# Patient Record
Sex: Female | Born: 1945
Health system: Southern US, Community
[De-identification: ages and names within clinical notes are randomized; demographics above are authoritative.]

## PROBLEM LIST (undated history)

## (undated) DIAGNOSIS — Z8719 Personal history of other diseases of the digestive system: Secondary | ICD-10-CM

## (undated) DIAGNOSIS — M419 Scoliosis, unspecified: Secondary | ICD-10-CM

## (undated) DIAGNOSIS — K5730 Diverticulosis of large intestine without perforation or abscess without bleeding: Secondary | ICD-10-CM

## (undated) DIAGNOSIS — M1990 Unspecified osteoarthritis, unspecified site: Secondary | ICD-10-CM

## (undated) DIAGNOSIS — K824 Cholesterolosis of gallbladder: Secondary | ICD-10-CM

## (undated) DIAGNOSIS — R928 Other abnormal and inconclusive findings on diagnostic imaging of breast: Secondary | ICD-10-CM

## (undated) DIAGNOSIS — I700 Atherosclerosis of aorta: Secondary | ICD-10-CM

## (undated) DIAGNOSIS — Z1211 Encounter for screening for malignant neoplasm of colon: Secondary | ICD-10-CM

## (undated) DIAGNOSIS — N6019 Diffuse cystic mastopathy of unspecified breast: Secondary | ICD-10-CM

## (undated) DIAGNOSIS — Z86010 Personal history of colonic polyps: Secondary | ICD-10-CM

## (undated) DIAGNOSIS — K7689 Other specified diseases of liver: Secondary | ICD-10-CM

## (undated) DIAGNOSIS — Z8489 Family history of other specified conditions: Secondary | ICD-10-CM

## (undated) DIAGNOSIS — E785 Hyperlipidemia, unspecified: Secondary | ICD-10-CM

## (undated) DIAGNOSIS — K219 Gastro-esophageal reflux disease without esophagitis: Secondary | ICD-10-CM

## (undated) DIAGNOSIS — Z87891 Personal history of nicotine dependence: Secondary | ICD-10-CM

## (undated) DIAGNOSIS — I209 Angina pectoris, unspecified: Secondary | ICD-10-CM

## (undated) HISTORY — DX: Other abnormal and inconclusive findings on diagnostic imaging of breast: R92.8

## (undated) HISTORY — PX: TOTAL KNEE ARTHROPLASTY: SHX125

## (undated) HISTORY — DX: Diffuse cystic mastopathy of unspecified breast: N60.19

## (undated) HISTORY — DX: Personal history of nicotine dependence: Z87.891

## (undated) HISTORY — PX: FOOT SURGERY: SHX648

## (undated) HISTORY — DX: Diverticulosis of large intestine without perforation or abscess without bleeding: K57.30

## (undated) HISTORY — PX: EYE SURGERY: SHX253

## (undated) HISTORY — PX: COLONOSCOPY: SHX174

## (undated) HISTORY — DX: Unspecified osteoarthritis, unspecified site: M19.90

## (undated) HISTORY — PX: TONSILLECTOMY: SUR1361

## (undated) HISTORY — DX: Personal history of colonic polyps: Z86.010

## (undated) HISTORY — DX: Encounter for screening for malignant neoplasm of colon: Z12.11

---

## 1989-04-25 HISTORY — PX: ABDOMINAL HYSTERECTOMY: SHX81

## 1998-04-25 DIAGNOSIS — Z860100 Personal history of colon polyps, unspecified: Secondary | ICD-10-CM

## 1998-04-25 HISTORY — DX: Personal history of colon polyps, unspecified: Z86.0100

## 1998-04-25 HISTORY — DX: Personal history of colonic polyps: Z86.010

## 1999-04-26 HISTORY — PX: FLEXIBLE SIGMOIDOSCOPY: SHX1649

## 2003-04-26 DIAGNOSIS — M1990 Unspecified osteoarthritis, unspecified site: Secondary | ICD-10-CM

## 2003-04-26 HISTORY — DX: Unspecified osteoarthritis, unspecified site: M19.90

## 2004-02-12 ENCOUNTER — Ambulatory Visit: Payer: Self-pay | Admitting: General Surgery

## 2005-03-01 ENCOUNTER — Ambulatory Visit: Payer: Self-pay | Admitting: General Surgery

## 2006-04-13 ENCOUNTER — Ambulatory Visit: Payer: Self-pay | Admitting: General Surgery

## 2006-08-18 ENCOUNTER — Ambulatory Visit: Payer: Self-pay | Admitting: General Surgery

## 2007-05-03 ENCOUNTER — Ambulatory Visit: Payer: Self-pay | Admitting: General Surgery

## 2008-05-05 ENCOUNTER — Ambulatory Visit: Payer: Self-pay | Admitting: General Surgery

## 2009-05-07 ENCOUNTER — Ambulatory Visit: Payer: Self-pay | Admitting: General Surgery

## 2010-04-25 HISTORY — PX: HAND SURGERY: SHX662

## 2010-05-12 ENCOUNTER — Ambulatory Visit: Payer: Self-pay | Admitting: General Surgery

## 2011-01-26 ENCOUNTER — Ambulatory Visit: Payer: Self-pay | Admitting: Urology

## 2011-04-26 DIAGNOSIS — Z87891 Personal history of nicotine dependence: Secondary | ICD-10-CM

## 2011-04-26 DIAGNOSIS — Z1211 Encounter for screening for malignant neoplasm of colon: Secondary | ICD-10-CM

## 2011-04-26 HISTORY — DX: Personal history of nicotine dependence: Z87.891

## 2011-04-26 HISTORY — DX: Encounter for screening for malignant neoplasm of colon: Z12.11

## 2011-05-17 ENCOUNTER — Ambulatory Visit: Payer: Self-pay | Admitting: General Surgery

## 2011-05-18 ENCOUNTER — Ambulatory Visit: Payer: Self-pay | Admitting: General Surgery

## 2011-07-15 ENCOUNTER — Ambulatory Visit: Payer: Self-pay | Admitting: General Surgery

## 2012-05-17 ENCOUNTER — Ambulatory Visit: Payer: Self-pay | Admitting: General Surgery

## 2012-05-22 ENCOUNTER — Ambulatory Visit: Payer: Self-pay | Admitting: General Surgery

## 2012-08-03 ENCOUNTER — Encounter: Payer: Self-pay | Admitting: *Deleted

## 2012-08-03 DIAGNOSIS — Z87891 Personal history of nicotine dependence: Secondary | ICD-10-CM | POA: Insufficient documentation

## 2012-08-03 DIAGNOSIS — Z860100 Personal history of colon polyps, unspecified: Secondary | ICD-10-CM | POA: Insufficient documentation

## 2012-08-03 DIAGNOSIS — Z1211 Encounter for screening for malignant neoplasm of colon: Secondary | ICD-10-CM | POA: Insufficient documentation

## 2012-08-08 ENCOUNTER — Other Ambulatory Visit: Payer: Self-pay | Admitting: *Deleted

## 2012-08-08 ENCOUNTER — Other Ambulatory Visit: Payer: Self-pay

## 2012-08-08 ENCOUNTER — Encounter: Payer: Self-pay | Admitting: General Surgery

## 2012-08-08 ENCOUNTER — Ambulatory Visit (INDEPENDENT_AMBULATORY_CARE_PROVIDER_SITE_OTHER): Payer: Medicare Other | Admitting: General Surgery

## 2012-08-08 VITALS — BP 110/80 | HR 84 | Resp 12 | Ht 66.0 in | Wt 143.0 lb

## 2012-08-08 DIAGNOSIS — R928 Other abnormal and inconclusive findings on diagnostic imaging of breast: Secondary | ICD-10-CM

## 2012-08-08 DIAGNOSIS — N6019 Diffuse cystic mastopathy of unspecified breast: Secondary | ICD-10-CM

## 2012-08-08 NOTE — Patient Instructions (Addendum)
Continue self breast exams.  Call for any new breast issues. Mammogram in 3 months

## 2012-08-08 NOTE — Progress Notes (Signed)
Patient ID: Yvonne White, female   DOB: Sep 07, 1945, 66 y.o.   MRN: [id number]  Chief Complaint  Patient presents with  . Follow-up    HPI Yvonne White is a 67 y.o. female.  Patient here today for 2 month follow up ultrasound of left breast.  Patient denies any new breast issues. On her annual follow up 2 months ago an ill-defined density was noted in superior central left breast.  Ultrasound at that location showed no findings. HPI  Past Medical History  Diagnosis Date  . Diverticulosis of colon (without mention of hemorrhage)   . Personal history of tobacco use, presenting hazards to health 2013  . Diffuse cystic mastopathy   . Abnormal mammogram, unspecified   . Arthritis 2005  . Personal history of colonic polyps 2000    large polyp  . Special screening for malignant neoplasms, colon 2013    family history of colon polyp and cancer    Past Surgical History  Procedure Laterality Date  . Abdominal hysterectomy  1991  . Foot surgery  2009,2010, 2014  . Colonoscopy  2000,2003,2008,2013    Dr. Cherly Anderson  . Flexible sigmoidoscopy  2001  . Hand surgery Left 2012    Family History  Problem Relation Age of Onset  . Colon polyps Mother     pre cancerous  . Cancer Maternal Aunt     breast cancer    Social History History  Substance Use Topics  . Smoking status: Former Smoker -- 20 years    Types: Cigarettes  . Smokeless tobacco: Not on file     Comment: quit 2005  . Alcohol Use: 1.0 oz/week    2 drink(s) per week    No Known Allergies  Current Outpatient Prescriptions  Medication Sig Dispense Refill  . aspirin 81 MG tablet Take 81 mg by mouth daily.      . Calcium Carbonate-Vitamin D (CALCIUM + D PO) Take by mouth.      . Cyanocobalamin (VITAMIN B 12 PO) Take 1 tablet by mouth daily.      . Diclofenac-Misoprostol (ARTHROTEC) 75-0.2 MG TBEC Take 2 tablets by mouth daily.      . misoprostol (CYTOTEC) 200 MCG tablet       . Multiple Vitamins-Minerals (MULTIVITAMIN  PO) Take by mouth.      . vitamin E 100 UNIT capsule Take 100 Units by mouth daily.       No current facility-administered medications for this visit.    Review of Systems Review of Systems  Constitutional: Negative.   Respiratory: Negative.   Cardiovascular: Negative.     Blood pressure 110/80, pulse 84, resp. rate 12, height 5\' 6"  (1.676 m), weight 143 lb (64.864 kg).  Physical Exam Physical Exam  Constitutional: She appears well-developed and well-nourished.  Neck: No mass and no thyromegaly present.  Pulmonary/Chest: Left breast exhibits no inverted nipple, no mass, no nipple discharge, no skin change and no tenderness.  Lymphadenopathy:    She has no cervical adenopathy.    She has no axillary adenopathy.      Data Reviewed [place] left breast repeated today-tin=y cysts at 3 o'cl, no finding superior central area where ill defined density was seen on mammogram  Assessment    Stable exam and [place]      Plan    F/U left mammogram in 2-3 mos.        Khadar Monger G 08/08/2012, 7:30 PM

## 2012-08-08 NOTE — Progress Notes (Signed)
The patient has been asked to return to the office in 3 months for a unilateral left breast diagnostic mammogram.

## 2012-11-20 ENCOUNTER — Ambulatory Visit: Payer: Self-pay | Admitting: General Surgery

## 2012-11-21 ENCOUNTER — Encounter: Payer: Self-pay | Admitting: General Surgery

## 2012-11-28 ENCOUNTER — Other Ambulatory Visit: Payer: Self-pay

## 2012-11-29 ENCOUNTER — Ambulatory Visit (INDEPENDENT_AMBULATORY_CARE_PROVIDER_SITE_OTHER): Payer: Medicare Other | Admitting: General Surgery

## 2012-11-29 ENCOUNTER — Encounter: Payer: Self-pay | Admitting: General Surgery

## 2012-11-29 VITALS — BP 118/70 | HR 64 | Resp 12 | Ht 67.0 in | Wt 143.0 lb

## 2012-11-29 DIAGNOSIS — N6019 Diffuse cystic mastopathy of unspecified breast: Secondary | ICD-10-CM

## 2012-11-29 NOTE — Patient Instructions (Addendum)
Patient to return in January 2015 with bilateral screening mammogram.

## 2012-11-29 NOTE — Progress Notes (Signed)
Patient ID: Yvonne White, female   DOB: 08-May-1945, 67 y.o.   MRN: [id number]  Chief Complaint  Patient presents with  . Follow-up    3 month f/o uni lt mammogram    HPI Yvonne White is a 67 y.o. female who presents for a 3 month follow up left breast mammogram. The most recent mammogram was done on 11/20/12 with a category 0. Left breast ultrasound was recommended to be done in our office today. No complaints at this time.   HPI  Past Medical History  Diagnosis Date  . Diverticulosis of colon (without mention of hemorrhage)   . Personal history of tobacco use, presenting hazards to health 2013  . Diffuse cystic mastopathy   . Abnormal mammogram, unspecified   . Arthritis 2005  . Personal history of colonic polyps 2000    large polyp  . Special screening for malignant neoplasms, colon 2013    family history of colon polyp and cancer    Past Surgical History  Procedure Laterality Date  . Abdominal hysterectomy  1991  . Foot surgery  2009,2010, 2014  . Colonoscopy  2000,2003,2008,2013    Dr. Cherly Anderson  . Flexible sigmoidoscopy  2001  . Hand surgery Left 2012    Family History  Problem Relation Age of Onset  . Colon polyps Mother     pre cancerous  . Cancer Maternal Aunt     breast cancer    Social History History  Substance Use Topics  . Smoking status: Former Smoker -- 20 years    Types: Cigarettes  . Smokeless tobacco: Not on file     Comment: quit 2005  . Alcohol Use: 1.0 oz/week    2 drink(s) per week    No Known Allergies  Current Outpatient Prescriptions  Medication Sig Dispense Refill  . aspirin 81 MG tablet Take 81 mg by mouth daily.      . Calcium Carbonate-Vitamin D (CALCIUM + D PO) Take by mouth.      . Cyanocobalamin (VITAMIN B 12 PO) Take 1 tablet by mouth daily.      . Diclofenac-Misoprostol (ARTHROTEC) 75-0.2 MG TBEC Take 2 tablets by mouth daily.      . Multiple Vitamins-Minerals (MULTIVITAMIN PO) Take by mouth.      . vitamin E 100 UNIT  capsule Take 100 Units by mouth daily.       No current facility-administered medications for this visit.    Review of Systems Review of Systems  Constitutional: Negative.   Respiratory: Negative.   Cardiovascular: Negative.     Blood pressure 118/70, pulse 64, resp. rate 12, height 5\' 7"  (1.702 m), weight 143 lb (64.864 kg).  Physical Exam Physical Exam  Constitutional: She is oriented to person, place, and time. She appears well-developed and well-nourished.  Eyes: Conjunctivae are normal. No scleral icterus.  Neck: Trachea normal. No thyromegaly present.  Pulmonary/Chest: Right breast exhibits no inverted nipple, no mass, no nipple discharge, no skin change and no tenderness. Left breast exhibits no inverted nipple, no mass, no nipple discharge, no skin change and no tenderness.  Lymphadenopathy:    She has no cervical adenopathy.    She has no axillary adenopathy.  Neurological: She is alert and oriented to person, place, and time.  Skin: Skin is warm and dry.    Data Reviewed Left mammogram prior density in superior location almost fully resolved.  She has had ultrasound of this area twice in the last 6 months with no significant findings.  Assessment  Stable exam. No new problems.      Plan    Patient to return in January 2015 with bilateral screening mammogram.        Kieth Brightly 11/29/2012, 6:58 PM

## 2013-02-28 ENCOUNTER — Other Ambulatory Visit: Payer: Self-pay

## 2013-05-20 ENCOUNTER — Ambulatory Visit: Payer: Self-pay | Admitting: General Surgery

## 2013-05-21 ENCOUNTER — Encounter: Payer: Self-pay | Admitting: General Surgery

## 2013-05-27 ENCOUNTER — Ambulatory Visit: Payer: Medicare Other | Admitting: General Surgery

## 2013-05-29 ENCOUNTER — Encounter: Payer: Self-pay | Admitting: General Surgery

## 2013-05-29 ENCOUNTER — Ambulatory Visit (INDEPENDENT_AMBULATORY_CARE_PROVIDER_SITE_OTHER): Payer: Medicare HMO | Admitting: General Surgery

## 2013-05-29 VITALS — BP 120/74 | HR 76 | Resp 12 | Ht 66.0 in | Wt 143.0 lb

## 2013-05-29 DIAGNOSIS — N6012 Diffuse cystic mastopathy of left breast: Secondary | ICD-10-CM

## 2013-05-29 DIAGNOSIS — Z86010 Personal history of colonic polyps: Secondary | ICD-10-CM

## 2013-05-29 DIAGNOSIS — N6011 Diffuse cystic mastopathy of right breast: Secondary | ICD-10-CM | POA: Insufficient documentation

## 2013-05-29 DIAGNOSIS — N6019 Diffuse cystic mastopathy of unspecified breast: Secondary | ICD-10-CM

## 2013-05-29 NOTE — Patient Instructions (Signed)
Follow up 1 year for bilateral screening mammogram.

## 2013-05-29 NOTE — Progress Notes (Signed)
Patient ID: Yvonne White, female   DOB: 1946/02/21, 68 y.o.   MRN: [id number]  Chief Complaint  Patient presents with  . Follow-up    mammogram    HPI Nyhla White is a 68 y.o. female who presents for a breast evaluation. The most recent mammogram was done on 05/20/13.Patient does perform regular self breast checks and gets regular mammograms done.    HPI  Past Medical History  Diagnosis Date  . Diverticulosis of colon (without mention of hemorrhage)   . Personal history of tobacco use, presenting hazards to health 2013  . Diffuse cystic mastopathy   . Abnormal mammogram, unspecified   . Arthritis 2005  . Personal history of colonic polyps 2000    large polyp  . Special screening for malignant neoplasms, colon 2013    family history of colon polyp and cancer    Past Surgical History  Procedure Laterality Date  . Abdominal hysterectomy  1991  . Foot surgery  2009,2010, 2014  . Colonoscopy  2000,2003,2008,2013    Dr. Bonna Gains  . Flexible sigmoidoscopy  2001  . Hand surgery Left 2012    Family History  Problem Relation Age of Onset  . Colon polyps Mother     pre cancerous  . Cancer Maternal Aunt     breast cancer    Social History History  Substance Use Topics  . Smoking status: Former Smoker -- 20 years    Types: Cigarettes  . Smokeless tobacco: Never Used     Comment: quit 2005  . Alcohol Use: 1.0 oz/week    2 drink(s) per week    No Known Allergies  Current Outpatient Prescriptions  Medication Sig Dispense Refill  . aspirin 81 MG tablet Take 81 mg by mouth daily.      . Calcium Carbonate-Vitamin D (CALCIUM + D PO) Take by mouth.      . Cyanocobalamin (VITAMIN B 12 PO) Take 1 tablet by mouth daily.      . diclofenac (VOLTAREN) 75 MG EC tablet Take 1 tablet by mouth daily.      . Diclofenac-Misoprostol (ARTHROTEC) 75-0.2 MG TBEC Take 2 tablets by mouth daily.      . misoprostol (CYTOTEC) 200 MCG tablet Take 1 tablet by mouth daily.      . Multiple  Vitamins-Minerals (MULTIVITAMIN PO) Take by mouth.      . vitamin E 100 UNIT capsule Take 100 Units by mouth daily.       No current facility-administered medications for this visit.    Review of Systems Review of Systems  Constitutional: Negative.   Respiratory: Negative.   Cardiovascular: Negative.     Blood pressure 120/74, pulse 76, resp. rate 12, height 5\' 6"  (1.676 m), weight 143 lb (64.864 kg).  Physical Exam Physical Exam  Constitutional: She appears well-developed and well-nourished.  Eyes: Conjunctivae are normal.  Cardiovascular: Normal rate, regular rhythm and normal heart sounds.   Pulses:      Radial pulses are 2+ on the right side, and 2+ on the left side.       Dorsalis pedis pulses are 2+ on the right side, and 2+ on the left side.       Posterior tibial pulses are 2+ on the right side, and 2+ on the left side.  No edema.  Pulmonary/Chest: Breath sounds normal. Right breast exhibits no inverted nipple, no mass, no nipple discharge, no skin change and no tenderness. Left breast exhibits no inverted nipple, no mass, no nipple discharge, no  skin change and no tenderness.  Abdominal: Soft. Normal appearance.  Lymphadenopathy:    She has no cervical adenopathy.    She has no axillary adenopathy.  Skin: Skin is warm and dry.    Data Reviewed Mammogram reviewed.  Stable..  Assessment    Stable exam. FH colon polyp    Plan    Return in 1 year with bilateral screening mammogram.        Yvonne White G 05/29/2013, 1:46 PM

## 2014-02-24 ENCOUNTER — Encounter: Payer: Self-pay | Admitting: General Surgery

## 2014-06-11 ENCOUNTER — Encounter: Payer: Self-pay | Admitting: General Surgery

## 2014-06-12 ENCOUNTER — Encounter: Payer: Self-pay | Admitting: General Surgery

## 2014-06-12 ENCOUNTER — Ambulatory Visit (INDEPENDENT_AMBULATORY_CARE_PROVIDER_SITE_OTHER): Payer: PPO | Admitting: General Surgery

## 2014-06-12 VITALS — BP 122/78 | HR 80 | Resp 12 | Ht 66.0 in | Wt 144.0 lb

## 2014-06-12 DIAGNOSIS — N6012 Diffuse cystic mastopathy of left breast: Secondary | ICD-10-CM

## 2014-06-12 DIAGNOSIS — Z8371 Family history of colonic polyps: Secondary | ICD-10-CM

## 2014-06-12 DIAGNOSIS — Z86010 Personal history of colonic polyps: Secondary | ICD-10-CM

## 2014-06-12 DIAGNOSIS — N6011 Diffuse cystic mastopathy of right breast: Secondary | ICD-10-CM

## 2014-06-12 NOTE — Progress Notes (Signed)
Patient ID: Yvonne White, female   DOB: 01/01/1946, 68 y.o.   MRN: [id number]  Chief Complaint  Patient presents with  . Follow-up    HPI Yvonne White is a 69 y.o. female.  who presents for her follow up mammogram and breast evaluation. The most recent mammogram was done on 06-05-14.  Patient does perform regular self breast checks and gets regular mammograms done.  No new breast issues.  HPI  Past Medical History  Diagnosis Date  . Diverticulosis of colon (without mention of hemorrhage)   . Personal history of tobacco use, presenting hazards to health 2013  . Diffuse cystic mastopathy   . Abnormal mammogram, unspecified   . Arthritis 2005  . Personal history of colonic polyps 2000    large polyp  . Special screening for malignant neoplasms, colon 2013    family history of colon polyp and cancer    Past Surgical History  Procedure Laterality Date  . Abdominal hysterectomy  1991  . Foot surgery  2009,2010, 2014  . Colonoscopy  2000,2003,2008,2013    Dr. Bonna Gains  . Flexible sigmoidoscopy  2001  . Hand surgery Left 2012    Family History  Problem Relation Age of Onset  . Colon polyps Mother     pre cancerous  . Cancer Maternal Aunt     breast cancer  . Colon polyps Sister     Social History History  Substance Use Topics  . Smoking status: Former Smoker -- 20 years    Types: Cigarettes  . Smokeless tobacco: Never Used     Comment: quit 2005  . Alcohol Use: 1.0 oz/week    2 drink(s) per week    No Known Allergies  Current Outpatient Prescriptions  Medication Sig Dispense Refill  . Cholecalciferol (VITAMIN D3) 3000 UNITS TABS Take by mouth as directed.    . Coenzyme Q10 (CO Q 10 PO) Take 400 mg by mouth daily.    . Cyanocobalamin (VITAMIN B 12 PO) Take 1 tablet by mouth as directed.     . diclofenac (VOLTAREN) 75 MG EC tablet Take 1 tablet by mouth daily.    . misoprostol (CYTOTEC) 200 MCG tablet Take 1 tablet by mouth daily.     No current  facility-administered medications for this visit.    Review of Systems Review of Systems  Constitutional: Negative.   Respiratory: Negative.   Cardiovascular: Negative.     Blood pressure 122/78, pulse 80, resp. rate 12, height 5\' 6"  (1.676 m), weight 144 lb (65.318 kg).  Physical Exam Physical Exam  Constitutional: She is oriented to person, place, and time. She appears well-developed and well-nourished.  Eyes: Conjunctivae are normal. No scleral icterus.  Neck: Neck supple.  Cardiovascular: Normal rate, regular rhythm and normal heart sounds.   Pulmonary/Chest: Effort normal and breath sounds normal. Right breast exhibits no inverted nipple, no mass, no nipple discharge, no skin change and no tenderness. Left breast exhibits mass. Left breast exhibits no inverted nipple, no nipple discharge, no skin change and no tenderness.  Ill defined mass 4 o'clock left breast previously noted in 2014.  Abdominal: Soft. There is no tenderness.  Lymphadenopathy:    She has no cervical adenopathy.    She has no axillary adenopathy.  Neurological: She is alert and oriented to person, place, and time.  Skin: Skin is warm and dry.    Data Reviewed Mammogram reviewed and stable.  Assessment    Stable physical exam. Ill defined mass 4 o'clock left  breast previously noted in 2014. FCD. PH of colon polyps and FH of colon polyp    Plan    The patient has been asked to return to the office in one year with a bilateral screeening mammogram.       Junie Panning G 06/12/2014, 3:30 PM

## 2014-06-12 NOTE — Patient Instructions (Signed)
The patient has been asked to return to the office in one year with a bilateral screeening mammogram Continue self breast exams. Call office for any new breast issues or concerns.

## 2015-04-09 ENCOUNTER — Other Ambulatory Visit: Payer: Self-pay | Admitting: *Deleted

## 2015-04-09 DIAGNOSIS — N6012 Diffuse cystic mastopathy of left breast: Secondary | ICD-10-CM

## 2015-04-09 DIAGNOSIS — N6011 Diffuse cystic mastopathy of right breast: Secondary | ICD-10-CM

## 2015-04-22 ENCOUNTER — Encounter: Payer: Self-pay | Admitting: Podiatry

## 2015-04-22 ENCOUNTER — Ambulatory Visit (INDEPENDENT_AMBULATORY_CARE_PROVIDER_SITE_OTHER): Payer: PPO | Admitting: Podiatry

## 2015-04-22 ENCOUNTER — Ambulatory Visit (INDEPENDENT_AMBULATORY_CARE_PROVIDER_SITE_OTHER): Payer: PPO

## 2015-04-22 VITALS — BP 119/64 | HR 70 | Resp 16

## 2015-04-22 DIAGNOSIS — M722 Plantar fascial fibromatosis: Secondary | ICD-10-CM

## 2015-04-22 DIAGNOSIS — M79674 Pain in right toe(s): Secondary | ICD-10-CM | POA: Diagnosis not present

## 2015-04-22 DIAGNOSIS — M2041 Other hammer toe(s) (acquired), right foot: Secondary | ICD-10-CM | POA: Diagnosis not present

## 2015-04-22 NOTE — Patient Instructions (Signed)
Pre-Operative Instructions  Congratulations, you have decided to take an important step to improving your quality of life.  You can be assured that the doctors of [facility] will be with you every step of the way.  1. Plan to be at the surgery center/hospital at least 1 (one) hour prior to your scheduled time unless otherwise directed by the surgical center/hospital staff.  You must have a responsible adult accompany you, remain during the surgery and drive you home.  Make sure you have directions to the surgical center/hospital and know how to get there on time. 2. For hospital based surgery you will need to obtain a history and physical form from your family physician within 1 month prior to the date of surgery- we will give you a form for you primary physician.  3. We make every effort to accommodate the date you request for surgery.  There are however, times where surgery dates or times have to be moved.  We will contact you as soon as possible if a change in schedule is required.   4. No Aspirin/Ibuprofen for one week before surgery.  If you are on aspirin, any non-steroidal anti-inflammatory medications (Mobic, Aleve, Ibuprofen) you should stop taking it 7 days prior to your surgery.  You make take Tylenol  For pain prior to surgery.  5. Medications- If you are taking daily heart and blood pressure medications, seizure, reflux, allergy, asthma, anxiety, pain or diabetes medications, make sure the surgery center/hospital is aware before the day of surgery so they may notify you which medications to take or avoid the day of surgery. 6. No food or drink after midnight the night before surgery unless directed otherwise by surgical center/hospital staff. 7. No alcoholic beverages 24 hours prior to surgery.  No smoking 24 hours prior to or 24 hours after surgery. 8. Wear loose pants or shorts- loose enough to fit over bandages, boots, and casts. 9. No slip on shoes, sneakers are best. 10. Bring  your boot with you to the surgery center/hospital.  Also bring crutches or a walker if your physician has prescribed it for you.  If you do not have this equipment, it will be provided for you after surgery. 11. If you have not been contracted by the surgery center/hospital by the day before your surgery, call to confirm the date and time of your surgery. 12. Leave-time from work may vary depending on the type of surgery you have.  Appropriate arrangements should be made prior to surgery with your employer. 13. Prescriptions will be provided immediately following surgery by your doctor.  Have these filled as soon as possible after surgery and take the medication as directed. 14. Remove nail polish on the operative foot. 15. Wash the night before surgery.  The night before surgery wash the foot and leg well with the antibacterial soap provided and water paying special attention to beneath the toenails and in between the toes.  Rinse thoroughly with water and dry well with a towel.  Perform this wash unless told not to do so by your physician.  Enclosed: 1 Ice pack (please put in freezer the night before surgery)   1 Hibiclens skin cleaner   Pre-op Instructions  If you have any questions regarding the instructions, do not hesitate to call our office.  [place]: [address] [place], [place] [address] [phone]  [place]: [address] [place], [place] [address] [phone]  [place]: [address]  [place], [place] [address] [phone]  Dr. Richard   Tuchman DPM, Dr. Norman Regal DPM Dr. Richard Sikora DPM, Dr. M. Todd Shailynn Fong DPM, Dr. Kathryn Egerton DPM 

## 2015-04-22 NOTE — Progress Notes (Signed)
He presents today with a chief complaint of painful toes and forefoot right. This been several years ago that she had a Keller arthroplasty performed which is done very well for her however it seems to be moving our allowing the toe to shift laterally. She has hammertoe deformities of the second third and fourth digits of the right foot. She likes to see about getting these straightened. Also complaining of pain at the level of second metatarsophalangeal joint. She denies any changes in her past medical history medications allergies surgery social history review of systems and overall general health.  Objective: Vital signs are stable alert and oriented 3. Pulses are strongly palpable right foot. Lateral deviation of the first metatarsophalangeal joint is present on physical exam consistent with Keller arthroplasty that appears to be dislocating. She also has pain on palpation and in range of motion of the second metatarsophalangeal joint of the right foot. Radiographs taken today in the office do demonstrate joint space narrowing of this joint with hammertoe deformity as well as a screw for internal fixation status post metatarsal osteotomy right foot. Hammertoes are visible today to toes #2 #3 and #4 of the right foot. These are painful palpation as well as range of motion. She also has a plantarflexed elongated third metatarsal.  Assessment: Painful first metatarsal phalangeal joint second metatarsal phalangeal joint and third metatarsophalangeal joint with hammertoe deformities #2 #3 and #4 right foot.  Plan: We discussed etiology pathology conservative versus surgical therapies. At this point I explained to her that we could perform a revision of the first metatarsophalangeal joint in order to alleviate pressure on the second toe. I also recommended to her a consent with removal of internal fixation second metatarsal but also consenting for hammertoe repair to toes 2 through 4 of the right foot and  a  third metatarsal osteotomy with screw fixation. I answered all the questions regarding these procedures to the best of my ability in layman's terms. She understands this and is amenable to it. We did discuss the possible postoperative complications which may include but are not limited to postop pain bleeding swelling infection and recurrence need for further surgery worsening of the deformities loss of digit loss of limb loss of life. I will follow-up with her in the near future for surgical intervention.

## 2015-04-28 DIAGNOSIS — S39013A Strain of muscle, fascia and tendon of pelvis, initial encounter: Secondary | ICD-10-CM | POA: Diagnosis not present

## 2015-04-28 DIAGNOSIS — M5416 Radiculopathy, lumbar region: Secondary | ICD-10-CM | POA: Diagnosis not present

## 2015-04-28 DIAGNOSIS — M9903 Segmental and somatic dysfunction of lumbar region: Secondary | ICD-10-CM | POA: Diagnosis not present

## 2015-04-28 DIAGNOSIS — M9905 Segmental and somatic dysfunction of pelvic region: Secondary | ICD-10-CM | POA: Diagnosis not present

## 2015-04-29 ENCOUNTER — Telehealth: Payer: Self-pay | Admitting: *Deleted

## 2015-04-29 NOTE — Telephone Encounter (Signed)
"  I was in there on the 28th.  I'm needing surgery.  They told me if I hadn't heard from you by this time this week to give you a call and see when my surgery is scheduled.  I'm looking for my surgery date or an update on it."  I'm returning your call.  Do you have a date in mind for the surgery?  He does surgery on Fridays.  "I can do it the soonest he has."  He can do it on 06/05/2015.  "That date works out fine because I have another appointment on February 2nd so that is perfect."

## 2015-05-05 ENCOUNTER — Telehealth: Payer: Self-pay | Admitting: *Deleted

## 2015-05-05 NOTE — Telephone Encounter (Signed)
I'm calling on behalf of Dr. Milinda Pointer to see if we can reschedule your surgery from 06/05/2011 to 05/29/15 or 06/12/2015.  "That is fine.  Let's do it on 05/29/2015."  Okay, thank you so much.

## 2015-05-19 DIAGNOSIS — M9903 Segmental and somatic dysfunction of lumbar region: Secondary | ICD-10-CM | POA: Diagnosis not present

## 2015-05-19 DIAGNOSIS — M9905 Segmental and somatic dysfunction of pelvic region: Secondary | ICD-10-CM | POA: Diagnosis not present

## 2015-05-19 DIAGNOSIS — S39013A Strain of muscle, fascia and tendon of pelvis, initial encounter: Secondary | ICD-10-CM | POA: Diagnosis not present

## 2015-05-19 DIAGNOSIS — M5416 Radiculopathy, lumbar region: Secondary | ICD-10-CM | POA: Diagnosis not present

## 2015-05-20 ENCOUNTER — Telehealth: Payer: Self-pay | Admitting: *Deleted

## 2015-05-20 NOTE — Telephone Encounter (Signed)
Attempting to obtain authorization surgery scheduled for 05/29/2015.  [employer] requested clinical notes.  Note was faxed to [employer].

## 2015-05-25 DIAGNOSIS — M5416 Radiculopathy, lumbar region: Secondary | ICD-10-CM | POA: Diagnosis not present

## 2015-05-25 DIAGNOSIS — S39013A Strain of muscle, fascia and tendon of pelvis, initial encounter: Secondary | ICD-10-CM | POA: Diagnosis not present

## 2015-05-25 DIAGNOSIS — M9905 Segmental and somatic dysfunction of pelvic region: Secondary | ICD-10-CM | POA: Diagnosis not present

## 2015-05-25 DIAGNOSIS — M9903 Segmental and somatic dysfunction of lumbar region: Secondary | ICD-10-CM | POA: Diagnosis not present

## 2015-05-27 NOTE — Telephone Encounter (Signed)
[employer] authorized surgery for 05/29/2015.  Authorization number is [id number].  I called and left a message for [name] at [facility] about the authorization.

## 2015-05-28 ENCOUNTER — Other Ambulatory Visit: Payer: Self-pay | Admitting: Podiatry

## 2015-05-28 ENCOUNTER — Ambulatory Visit: Payer: Self-pay

## 2015-05-28 MED ORDER — OXYCODONE-ACETAMINOPHEN 10-325 MG PO TABS: 1.0000 | ORAL_TABLET | ORAL | Status: DC | PRN

## 2015-05-28 MED ORDER — PROMETHAZINE HCL 25 MG PO TABS: 25.0000 mg | ORAL_TABLET | Freq: Three times a day (TID) | ORAL | Status: DC | PRN

## 2015-05-28 MED ORDER — CEPHALEXIN 500 MG PO CAPS: 500.0000 mg | ORAL_CAPSULE | Freq: Three times a day (TID) | ORAL | Status: DC

## 2015-05-29 ENCOUNTER — Encounter: Payer: Self-pay | Admitting: Podiatry

## 2015-05-29 DIAGNOSIS — T8484XA Pain due to internal orthopedic prosthetic devices, implants and grafts, initial encounter: Secondary | ICD-10-CM | POA: Diagnosis not present

## 2015-05-29 DIAGNOSIS — M205X1 Other deformities of toe(s) (acquired), right foot: Secondary | ICD-10-CM | POA: Diagnosis not present

## 2015-05-29 DIAGNOSIS — M21549 Acquired clubfoot, unspecified foot: Secondary | ICD-10-CM | POA: Diagnosis not present

## 2015-05-29 DIAGNOSIS — M25571 Pain in right ankle and joints of right foot: Secondary | ICD-10-CM | POA: Diagnosis not present

## 2015-05-29 DIAGNOSIS — M216X1 Other acquired deformities of right foot: Secondary | ICD-10-CM | POA: Diagnosis not present

## 2015-05-29 DIAGNOSIS — M2041 Other hammer toe(s) (acquired), right foot: Secondary | ICD-10-CM | POA: Diagnosis not present

## 2015-05-29 DIAGNOSIS — Z01818 Encounter for other preprocedural examination: Secondary | ICD-10-CM | POA: Diagnosis not present

## 2015-05-29 DIAGNOSIS — Z4889 Encounter for other specified surgical aftercare: Secondary | ICD-10-CM | POA: Diagnosis not present

## 2015-05-29 DIAGNOSIS — M722 Plantar fascial fibromatosis: Secondary | ICD-10-CM | POA: Diagnosis not present

## 2015-05-29 DIAGNOSIS — M2011 Hallux valgus (acquired), right foot: Secondary | ICD-10-CM | POA: Diagnosis not present

## 2015-06-03 ENCOUNTER — Ambulatory Visit (INDEPENDENT_AMBULATORY_CARE_PROVIDER_SITE_OTHER): Payer: PPO | Admitting: Podiatry

## 2015-06-03 ENCOUNTER — Ambulatory Visit (INDEPENDENT_AMBULATORY_CARE_PROVIDER_SITE_OTHER): Payer: PPO

## 2015-06-03 DIAGNOSIS — Z9889 Other specified postprocedural states: Secondary | ICD-10-CM | POA: Diagnosis not present

## 2015-06-03 DIAGNOSIS — M722 Plantar fascial fibromatosis: Secondary | ICD-10-CM

## 2015-06-03 DIAGNOSIS — M2041 Other hammer toe(s) (acquired), right foot: Secondary | ICD-10-CM | POA: Diagnosis not present

## 2015-06-03 DIAGNOSIS — M7741 Metatarsalgia, right foot: Secondary | ICD-10-CM

## 2015-06-03 DIAGNOSIS — T84498D Other mechanical complication of other internal orthopedic devices, implants and grafts, subsequent encounter: Secondary | ICD-10-CM

## 2015-06-03 DIAGNOSIS — Z472 Encounter for removal of internal fixation device: Secondary | ICD-10-CM

## 2015-06-03 NOTE — Progress Notes (Signed)
She presents today 6 days status post Keller arthroplasty revision first metatarsophalangeal joint right foot removal internal fixation second metatarsal right foot third metatarsal osteotomy with double screw fixation. Hammertoe repair with screws to toes #2 #3 and #4 of the right foot. I also injected a plantar fibroma to the plantar medial aspect of her right foot. She states that she has had very little pain. He states that the nerve block lasted until Sunday. She is taking no pain medication.  Objective: Vital signs are stable she is alert and oriented 3. Pulses are palpable. Dry sterile dressing was removed demonstrates moderate edema no erythema saline as drainage or odor sutures are intact as well coapted is great range of motion of the first metatarsophalangeal joint right foot toes are nice and rectus. Radiograph demonstrates perfectly placed [facility] repair with removal of all of the spurs removal of internal fixation second metatarsal and internal fixation placed to the digits and the third metatarsal. All in good alignment. All with good compression.  Assessment: Well-healing surgical foot right 1 week.  Plan: [place] today dresser compressive dressing follow-up with me in 1 week.

## 2015-06-04 ENCOUNTER — Ambulatory Visit: Payer: PPO | Admitting: General Surgery

## 2015-06-10 ENCOUNTER — Encounter: Payer: Self-pay | Admitting: Podiatry

## 2015-06-10 ENCOUNTER — Ambulatory Visit (INDEPENDENT_AMBULATORY_CARE_PROVIDER_SITE_OTHER): Payer: PPO | Admitting: Podiatry

## 2015-06-10 VITALS — BP 114/64 | HR 69 | Resp 16

## 2015-06-10 DIAGNOSIS — Z9889 Other specified postprocedural states: Secondary | ICD-10-CM | POA: Diagnosis not present

## 2015-06-10 DIAGNOSIS — M722 Plantar fascial fibromatosis: Secondary | ICD-10-CM

## 2015-06-10 DIAGNOSIS — M2041 Other hammer toe(s) (acquired), right foot: Secondary | ICD-10-CM | POA: Diagnosis not present

## 2015-06-10 DIAGNOSIS — M7741 Metatarsalgia, right foot: Secondary | ICD-10-CM | POA: Diagnosis not present

## 2015-06-10 DIAGNOSIS — Z472 Encounter for removal of internal fixation device: Secondary | ICD-10-CM

## 2015-06-10 DIAGNOSIS — T84498D Other mechanical complication of other internal orthopedic devices, implants and grafts, subsequent encounter: Secondary | ICD-10-CM | POA: Diagnosis not present

## 2015-06-10 DIAGNOSIS — M79673 Pain in unspecified foot: Secondary | ICD-10-CM

## 2015-06-10 NOTE — Progress Notes (Signed)
She presents today 2 weeks status post Keller arthroplasty third metatarsal osteotomy and hammertoe repair #2 #3 and #4 with screws. She states that she is doing great and ready to get out of this boot.  Objective: Vital signs are stable she is alert and oriented 3. Pulses are in tact. Sutures are intact margins are well coapted. We've removed the sutures today and I placed Steri-Strips. The wounds appear to be in good position with good range of motion.  Assessment: Well-healing surgical foot right.  Plan: Placed her in a Darco digital splint as well as a compression anklet. I will follow-up with her in 2 weeks I will allow her to start getting this wet.

## 2015-06-12 ENCOUNTER — Other Ambulatory Visit: Payer: Self-pay | Admitting: General Surgery

## 2015-06-12 ENCOUNTER — Ambulatory Visit
Admission: RE | Admit: 2015-06-12 | Discharge: 2015-06-12 | Disposition: A | Discharge status: Home / Self Care | Payer: PPO | Source: Ambulatory Visit | Attending: General Surgery | Admitting: General Surgery

## 2015-06-12 DIAGNOSIS — N6011 Diffuse cystic mastopathy of right breast: Secondary | ICD-10-CM

## 2015-06-12 DIAGNOSIS — Z1231 Encounter for screening mammogram for malignant neoplasm of breast: Secondary | ICD-10-CM | POA: Insufficient documentation

## 2015-06-12 DIAGNOSIS — N6012 Diffuse cystic mastopathy of left breast: Secondary | ICD-10-CM

## 2015-06-15 DIAGNOSIS — M9903 Segmental and somatic dysfunction of lumbar region: Secondary | ICD-10-CM | POA: Diagnosis not present

## 2015-06-15 DIAGNOSIS — M5416 Radiculopathy, lumbar region: Secondary | ICD-10-CM | POA: Diagnosis not present

## 2015-06-15 DIAGNOSIS — M9905 Segmental and somatic dysfunction of pelvic region: Secondary | ICD-10-CM | POA: Diagnosis not present

## 2015-06-15 DIAGNOSIS — S39013A Strain of muscle, fascia and tendon of pelvis, initial encounter: Secondary | ICD-10-CM | POA: Diagnosis not present

## 2015-06-17 ENCOUNTER — Encounter: Payer: Self-pay | Admitting: Podiatry

## 2015-06-18 ENCOUNTER — Ambulatory Visit: Payer: PPO | Admitting: General Surgery

## 2015-06-18 DIAGNOSIS — L918 Other hypertrophic disorders of the skin: Secondary | ICD-10-CM | POA: Diagnosis not present

## 2015-06-18 DIAGNOSIS — Z808 Family history of malignant neoplasm of other organs or systems: Secondary | ICD-10-CM | POA: Diagnosis not present

## 2015-06-18 DIAGNOSIS — Z1283 Encounter for screening for malignant neoplasm of skin: Secondary | ICD-10-CM | POA: Diagnosis not present

## 2015-06-18 DIAGNOSIS — L820 Inflamed seborrheic keratosis: Secondary | ICD-10-CM | POA: Diagnosis not present

## 2015-06-18 DIAGNOSIS — L812 Freckles: Secondary | ICD-10-CM | POA: Diagnosis not present

## 2015-06-18 DIAGNOSIS — I789 Disease of capillaries, unspecified: Secondary | ICD-10-CM | POA: Diagnosis not present

## 2015-06-18 DIAGNOSIS — L821 Other seborrheic keratosis: Secondary | ICD-10-CM | POA: Diagnosis not present

## 2015-06-18 DIAGNOSIS — D229 Melanocytic nevi, unspecified: Secondary | ICD-10-CM | POA: Diagnosis not present

## 2015-06-24 ENCOUNTER — Encounter: Payer: Self-pay | Admitting: Podiatry

## 2015-06-24 ENCOUNTER — Ambulatory Visit (INDEPENDENT_AMBULATORY_CARE_PROVIDER_SITE_OTHER): Payer: PPO

## 2015-06-24 ENCOUNTER — Ambulatory Visit (INDEPENDENT_AMBULATORY_CARE_PROVIDER_SITE_OTHER): Payer: PPO | Admitting: Podiatry

## 2015-06-24 VITALS — BP 123/69 | HR 81 | Resp 16

## 2015-06-24 DIAGNOSIS — M7741 Metatarsalgia, right foot: Secondary | ICD-10-CM

## 2015-06-24 DIAGNOSIS — Z9889 Other specified postprocedural states: Secondary | ICD-10-CM

## 2015-06-24 DIAGNOSIS — M2041 Other hammer toe(s) (acquired), right foot: Secondary | ICD-10-CM

## 2015-06-24 DIAGNOSIS — Z472 Encounter for removal of internal fixation device: Secondary | ICD-10-CM

## 2015-06-24 DIAGNOSIS — T84498D Other mechanical complication of other internal orthopedic devices, implants and grafts, subsequent encounter: Secondary | ICD-10-CM

## 2015-06-24 DIAGNOSIS — M722 Plantar fascial fibromatosis: Secondary | ICD-10-CM

## 2015-06-24 NOTE — Progress Notes (Signed)
She presents today for postop visit one months at expose [facility] arthroplasty revision third metatarsal osteotomy and hammertoe repairs to toes #2 #3 and number for the right foot. She states that she still has some swelling and some tenderness and she has not had this in the water yet. She does state that she has been soaking some. She notices small spot pop-up as if this Healed off the proximal most incision site of the first metatarsal phalangeal joint incision. She denies fever chills nausea vomiting muscle aches and pains.  Objective: Vital signs are stable she is alert and oriented 3. Right lower extremity demonstrates strong palpable pulses mild edema. Toes are rectus she has mild valgus deformity of the hallux right but good range of motion of all of these joints. Radiographs confirm internal fixation is is in good condition and all joints are congruous.  Assessment: Well-healing surgical foot right 1 month.  Plan: I'm going to request that she stay in the [employer] shoe for another 2 weeks. I will request that the small dehiscence of the proximal most medial incision heals up all the way she will watch this for signs and symptoms of infection should any right she will notify us immediately. Once 2 weeks have passed provided our wound has healed she will get back into regular shoe gear and follow up with me in 1 month.

## 2015-06-30 ENCOUNTER — Ambulatory Visit (INDEPENDENT_AMBULATORY_CARE_PROVIDER_SITE_OTHER): Payer: PPO | Admitting: General Surgery

## 2015-06-30 ENCOUNTER — Other Ambulatory Visit: Payer: Self-pay

## 2015-06-30 ENCOUNTER — Encounter: Payer: Self-pay | Admitting: General Surgery

## 2015-06-30 VITALS — BP 112/66 | HR 74 | Resp 12 | Ht 65.0 in | Wt 145.0 lb

## 2015-06-30 DIAGNOSIS — Z86010 Personal history of colonic polyps: Secondary | ICD-10-CM | POA: Insufficient documentation

## 2015-06-30 DIAGNOSIS — N6011 Diffuse cystic mastopathy of right breast: Secondary | ICD-10-CM

## 2015-06-30 DIAGNOSIS — N6012 Diffuse cystic mastopathy of left breast: Secondary | ICD-10-CM

## 2015-06-30 DIAGNOSIS — N6320 Unspecified lump in the left breast, unspecified quadrant: Secondary | ICD-10-CM

## 2015-06-30 DIAGNOSIS — N63 Unspecified lump in breast: Secondary | ICD-10-CM | POA: Diagnosis not present

## 2015-06-30 NOTE — Patient Instructions (Signed)
Patient will be asked to return to the office in 6 weeks for exam and possible ultrasound.

## 2015-06-30 NOTE — Progress Notes (Signed)
Patient ID: Yvonne White, female   DOB: [birth year], 70 y.o.   MRN: [id number]  Chief Complaint  Patient presents with  . Follow-up    mammogram    HPI Yvonne White is a 70 y.o. female who presents for a breast evaluation. The most recent mammogram was done on 06/12/15  Patient does perform regular self breast checks and gets regular mammograms done.   I have reviewed the history of present illness with the patient.   HPI  Past Medical History  Diagnosis Date  . Diverticulosis of colon (without mention of hemorrhage)   . Personal history of tobacco use, presenting hazards to health 2013  . Diffuse cystic mastopathy   . Abnormal mammogram, unspecified   . Arthritis 2005  . Personal history of colonic polyps 2000    large polyp  . Special screening for malignant neoplasms, colon 2013    family history of colon polyp and cancer    Past Surgical History  Procedure Laterality Date  . Abdominal hysterectomy  1991  . Foot surgery  2009,2010, [id number]  . Colonoscopy  2000,2003,2008,2013    Dr. Bonna Gains  . Flexible sigmoidoscopy  2001  . Hand surgery Left 2012    Family History  Problem Relation Age of Onset  . Colon polyps Mother     pre cancerous  . Cancer Maternal Aunt     breast cancer  . Breast cancer Maternal Aunt 38  . Colon polyps Sister     Social History Social History  Substance Use Topics  . Smoking status: Former Smoker -- 20 years    Types: Cigarettes  . Smokeless tobacco: Never Used     Comment: quit 2005  . Alcohol Use: 1.0 oz/week    2 drink(s) per week    No Known Allergies  Current Outpatient Prescriptions  Medication Sig Dispense Refill  . Cholecalciferol (VITAMIN D3) 3000 UNITS TABS Take by mouth as directed.    . Coenzyme Q10 (CO Q 10 PO) Take by mouth.    . Cyanocobalamin (VITAMIN B 12 PO) Take 1 tablet by mouth as directed.     . diclofenac (VOLTAREN) 75 MG EC tablet Take 1 tablet by mouth daily.    . misoprostol (CYTOTEC) 200 MCG  tablet Take 1 tablet by mouth daily.     No current facility-administered medications for this visit.    Review of Systems Review of Systems  Constitutional: Negative.   Respiratory: Negative.   Cardiovascular: Negative.     Blood pressure 112/66, pulse 74, resp. rate 12, height 5\' 5"  (1.651 m), weight 145 lb (65.772 kg).  Physical Exam Physical Exam  Constitutional: She is oriented to person, place, and time. She appears well-developed and well-nourished.  Eyes: Conjunctivae are normal. No scleral icterus.  Neck: Neck supple.  Cardiovascular: Normal rate, regular rhythm and normal heart sounds.   Pulmonary/Chest: Effort normal and breath sounds normal. Right breast exhibits no inverted nipple, no mass, no nipple discharge, no skin change and no tenderness. Left breast exhibits mass. Left breast exhibits no inverted nipple, no nipple discharge, no skin change and no tenderness.  Left breast - 1.5 cm mass at 4 o'clock 3cm from nipple.  Abdominal: Soft. Normal appearance and bowel sounds are normal. There is no hepatomegaly.  Lymphadenopathy:    She has no cervical adenopathy.    She has no axillary adenopathy.  Neurological: She is alert and oriented to person, place, and time.  Skin: Skin is warm and dry.  Data Reviewed Mammogram reviewed, no abnormalities noted.   Assessment      FCD  Ill defined mass 4 o'clock left breast targeted US on this area shows what appears to be benign prominence of fibroglandular tissue. This can be followed  PH of colon polyps and FH of colon polyp     Plan    Patient will be asked to return to the office in 6 weeks for exam and possible ultrasound. Biopsy if the mass persists.     PCP:  Tracie Harrier This information has been scribed by Karie Fetch RNBC.     Tymeka Privette G 06/30/2015, 1:58 PM

## 2015-07-02 DIAGNOSIS — M170 Bilateral primary osteoarthritis of knee: Secondary | ICD-10-CM | POA: Diagnosis not present

## 2015-07-06 DIAGNOSIS — S39013A Strain of muscle, fascia and tendon of pelvis, initial encounter: Secondary | ICD-10-CM | POA: Diagnosis not present

## 2015-07-06 DIAGNOSIS — M5416 Radiculopathy, lumbar region: Secondary | ICD-10-CM | POA: Diagnosis not present

## 2015-07-06 DIAGNOSIS — M9905 Segmental and somatic dysfunction of pelvic region: Secondary | ICD-10-CM | POA: Diagnosis not present

## 2015-07-06 DIAGNOSIS — M9903 Segmental and somatic dysfunction of lumbar region: Secondary | ICD-10-CM | POA: Diagnosis not present

## 2015-07-07 NOTE — Progress Notes (Signed)
Patient ID: Yvonne White, female   DOB: 1945/11/12, 70 y.o.   MRN: [id number] Dr Milinda Pointer performed a Jake Michaelis Revision Rt, removal of screw 2nd metatarsal Rt, 3rd metatarsal Rt osteotomy with screw, hammertoe repair with screws 2,3,4, injection into plantar fibroma Rt foot on 05/29/15 at [facility].

## 2015-07-08 ENCOUNTER — Encounter: Payer: Self-pay | Admitting: Podiatry

## 2015-07-09 DIAGNOSIS — M25562 Pain in left knee: Secondary | ICD-10-CM | POA: Diagnosis not present

## 2015-07-09 DIAGNOSIS — M25561 Pain in right knee: Secondary | ICD-10-CM | POA: Diagnosis not present

## 2015-07-09 DIAGNOSIS — M170 Bilateral primary osteoarthritis of knee: Secondary | ICD-10-CM | POA: Diagnosis not present

## 2015-07-14 DIAGNOSIS — M25561 Pain in right knee: Secondary | ICD-10-CM | POA: Diagnosis not present

## 2015-07-14 DIAGNOSIS — M1711 Unilateral primary osteoarthritis, right knee: Secondary | ICD-10-CM | POA: Diagnosis not present

## 2015-07-16 DIAGNOSIS — M25562 Pain in left knee: Secondary | ICD-10-CM | POA: Diagnosis not present

## 2015-07-16 DIAGNOSIS — M1712 Unilateral primary osteoarthritis, left knee: Secondary | ICD-10-CM | POA: Diagnosis not present

## 2015-07-21 DIAGNOSIS — M1711 Unilateral primary osteoarthritis, right knee: Secondary | ICD-10-CM | POA: Diagnosis not present

## 2015-07-21 DIAGNOSIS — M25561 Pain in right knee: Secondary | ICD-10-CM | POA: Diagnosis not present

## 2015-07-22 ENCOUNTER — Ambulatory Visit (INDEPENDENT_AMBULATORY_CARE_PROVIDER_SITE_OTHER): Payer: PPO | Admitting: Podiatry

## 2015-07-22 ENCOUNTER — Ambulatory Visit (INDEPENDENT_AMBULATORY_CARE_PROVIDER_SITE_OTHER): Payer: PPO

## 2015-07-22 ENCOUNTER — Encounter: Payer: Self-pay | Admitting: Podiatry

## 2015-07-22 VITALS — BP 118/57 | HR 86 | Resp 16

## 2015-07-22 DIAGNOSIS — Z9889 Other specified postprocedural states: Secondary | ICD-10-CM | POA: Diagnosis not present

## 2015-07-22 DIAGNOSIS — M2041 Other hammer toe(s) (acquired), right foot: Secondary | ICD-10-CM

## 2015-07-22 DIAGNOSIS — M722 Plantar fascial fibromatosis: Secondary | ICD-10-CM

## 2015-07-22 DIAGNOSIS — M7741 Metatarsalgia, right foot: Secondary | ICD-10-CM | POA: Diagnosis not present

## 2015-07-22 DIAGNOSIS — T84498D Other mechanical complication of other internal orthopedic devices, implants and grafts, subsequent encounter: Secondary | ICD-10-CM

## 2015-07-22 DIAGNOSIS — Z472 Encounter for removal of internal fixation device: Secondary | ICD-10-CM

## 2015-07-22 NOTE — Progress Notes (Signed)
She presents today for follow-up of her right foot surgery. She states this seems to be doing really well except for a sore that she seems to be nursing along on the medial aspect of the fourth digit right foot. She states that nothing is just because these toes are still swollen and it rubs. She is status post arthroplasty joint replacement redo as well as a hammertoe repair second third and fourth digits of the right foot with screws.  Objective: Vital signs are stable she is alert and oriented 3. No open lesions or wounds are present. She has good range of motion of the first metatarsophalangeal joint and the toes are rectus. Radiographic evaluation demonstrates screws to the second third and fourth digits of the right foot are intact with good arthrodesis of the PIPJ's. The first metatarsophalangeal joint replacement is in good position with grommets.  Assessment: Well-healing surgical foot right.  Plan: Follow up with me in 1 month. I encouraged her to keep a spacer or something between those toes to keep them from rubbing together. I also encouraged massage therapy to help break down the scar tissue so that the fluid would not be retained.

## 2015-07-23 DIAGNOSIS — M1712 Unilateral primary osteoarthritis, left knee: Secondary | ICD-10-CM | POA: Diagnosis not present

## 2015-07-23 DIAGNOSIS — M25562 Pain in left knee: Secondary | ICD-10-CM | POA: Diagnosis not present

## 2015-07-27 DIAGNOSIS — M9905 Segmental and somatic dysfunction of pelvic region: Secondary | ICD-10-CM | POA: Diagnosis not present

## 2015-07-27 DIAGNOSIS — M9903 Segmental and somatic dysfunction of lumbar region: Secondary | ICD-10-CM | POA: Diagnosis not present

## 2015-07-27 DIAGNOSIS — S39013A Strain of muscle, fascia and tendon of pelvis, initial encounter: Secondary | ICD-10-CM | POA: Diagnosis not present

## 2015-07-27 DIAGNOSIS — M5416 Radiculopathy, lumbar region: Secondary | ICD-10-CM | POA: Diagnosis not present

## 2015-07-28 DIAGNOSIS — M1711 Unilateral primary osteoarthritis, right knee: Secondary | ICD-10-CM | POA: Diagnosis not present

## 2015-07-28 DIAGNOSIS — M25561 Pain in right knee: Secondary | ICD-10-CM | POA: Diagnosis not present

## 2015-07-30 DIAGNOSIS — M25562 Pain in left knee: Secondary | ICD-10-CM | POA: Diagnosis not present

## 2015-07-30 DIAGNOSIS — M1712 Unilateral primary osteoarthritis, left knee: Secondary | ICD-10-CM | POA: Diagnosis not present

## 2015-08-03 DIAGNOSIS — M25562 Pain in left knee: Secondary | ICD-10-CM | POA: Diagnosis not present

## 2015-08-03 DIAGNOSIS — M170 Bilateral primary osteoarthritis of knee: Secondary | ICD-10-CM | POA: Diagnosis not present

## 2015-08-03 DIAGNOSIS — M25561 Pain in right knee: Secondary | ICD-10-CM | POA: Diagnosis not present

## 2015-08-11 ENCOUNTER — Ambulatory Visit: Payer: PPO | Admitting: General Surgery

## 2015-08-13 ENCOUNTER — Encounter: Payer: Self-pay | Admitting: General Surgery

## 2015-08-13 ENCOUNTER — Ambulatory Visit (INDEPENDENT_AMBULATORY_CARE_PROVIDER_SITE_OTHER): Payer: PPO | Admitting: General Surgery

## 2015-08-13 VITALS — BP 108/64 | HR 70 | Resp 12 | Ht 64.5 in | Wt 144.0 lb

## 2015-08-13 DIAGNOSIS — N63 Unspecified lump in breast: Secondary | ICD-10-CM | POA: Diagnosis not present

## 2015-08-13 DIAGNOSIS — N6320 Unspecified lump in the left breast, unspecified quadrant: Secondary | ICD-10-CM

## 2015-08-13 NOTE — Progress Notes (Signed)
Patient ID: Yvonne White, female   DOB: 05-Jun-1945, 70 y.o.   MRN: [id number]  Chief Complaint  Patient presents with  . Follow-up    breast mass    HPI Yvonne White is a 70 y.o. female.  Here today for follow up left breast mass. She has no new complaints. I have reviewed the history of present illness with the patient.  HPI  Past Medical History  Diagnosis Date  . Diverticulosis of colon (without mention of hemorrhage)   . Personal history of tobacco use, presenting hazards to health 2013  . Diffuse cystic mastopathy   . Abnormal mammogram, unspecified   . Arthritis 2005  . Personal history of colonic polyps 2000    large polyp  . Special screening for malignant neoplasms, colon 2013    family history of colon polyp and cancer    Past Surgical History  Procedure Laterality Date  . Abdominal hysterectomy  1991  . Foot surgery  2009,2010, [id number]  . Colonoscopy  2000,2003,2008,2013    Dr. Bonna Gains  . Flexible sigmoidoscopy  2001  . Hand surgery Left 2012    Family History  Problem Relation Age of Onset  . Colon polyps Mother     pre cancerous  . Cancer Maternal Aunt     breast cancer  . Breast cancer Maternal Aunt 62  . Colon polyps Sister     Social History Social History  Substance Use Topics  . Smoking status: Former Smoker -- 20 years    Types: Cigarettes  . Smokeless tobacco: Never Used     Comment: quit 2005  . Alcohol Use: 1.0 oz/week    2 drink(s) per week    No Known Allergies  Current Outpatient Prescriptions  Medication Sig Dispense Refill  . Cholecalciferol (VITAMIN D3) 3000 UNITS TABS Take by mouth as directed.    . Coenzyme Q10 (CO Q 10 PO) Take by mouth.    . Cyanocobalamin (VITAMIN B 12 PO) Take 1 tablet by mouth as directed.     . diclofenac (VOLTAREN) 75 MG EC tablet Take 1 tablet by mouth daily.    . misoprostol (CYTOTEC) 200 MCG tablet Take 1 tablet by mouth daily.     No current facility-administered medications for this  visit.    Review of Systems Review of Systems  Constitutional: Negative.   Respiratory: Negative.   Cardiovascular: Negative.     Blood pressure 108/64, pulse 70, resp. rate 12, height 5' 4.5" (1.638 m), weight 144 lb (65.318 kg).  Physical Exam Physical Exam  Constitutional: She is oriented to person, place, and time. She appears well-developed and well-nourished.  Eyes: Conjunctivae are normal. No scleral icterus.  Neck: Neck supple.  Cardiovascular: Normal rate.   Pulmonary/Chest: Right breast exhibits no inverted nipple, no mass, no nipple discharge, no skin change and no tenderness. Left breast exhibits mass. Left breast exhibits no inverted nipple, no nipple discharge, no skin change and no tenderness.    1 cm ill defined mass left breast  Abdominal: Normal appearance.  Lymphadenopathy:    She has no cervical adenopathy.    She has no axillary adenopathy.  Neurological: She is alert and oriented to person, place, and time.  Skin: Skin is warm and dry.    Data Reviewed Prior notes  Assessment    Left breast mass was originally noted to be 1.5cm in length. Mass is reduced in size to 1cm.    Plan    Due to the decreasing of  the mass and normal [place] last time will continue to follow- patient is to follow up in 3-4 months. Continue self breast exams. Call office for any new breast issues or concerns.      PCP:  Tracie Harrier This information has been scribed by Karie Fetch RN, BSN,BC.   SANKAR,SEEPLAPUTHUR G 08/13/2015, 2:28 PM

## 2015-08-13 NOTE — Patient Instructions (Addendum)
The patient is aware to call back for any questions or concerns. Follow up in 3-4 months. Continue self breast exams. Call office for any new breast issues or concerns.

## 2015-08-18 DIAGNOSIS — M9905 Segmental and somatic dysfunction of pelvic region: Secondary | ICD-10-CM | POA: Diagnosis not present

## 2015-08-18 DIAGNOSIS — S39013A Strain of muscle, fascia and tendon of pelvis, initial encounter: Secondary | ICD-10-CM | POA: Diagnosis not present

## 2015-08-18 DIAGNOSIS — M9903 Segmental and somatic dysfunction of lumbar region: Secondary | ICD-10-CM | POA: Diagnosis not present

## 2015-08-18 DIAGNOSIS — M5416 Radiculopathy, lumbar region: Secondary | ICD-10-CM | POA: Diagnosis not present

## 2015-08-26 ENCOUNTER — Ambulatory Visit (INDEPENDENT_AMBULATORY_CARE_PROVIDER_SITE_OTHER): Payer: PPO

## 2015-08-26 ENCOUNTER — Encounter: Payer: Self-pay | Admitting: Podiatry

## 2015-08-26 ENCOUNTER — Ambulatory Visit (INDEPENDENT_AMBULATORY_CARE_PROVIDER_SITE_OTHER): Payer: PPO | Admitting: Podiatry

## 2015-08-26 DIAGNOSIS — Z472 Encounter for removal of internal fixation device: Secondary | ICD-10-CM

## 2015-08-26 DIAGNOSIS — M7741 Metatarsalgia, right foot: Secondary | ICD-10-CM

## 2015-08-26 DIAGNOSIS — M2041 Other hammer toe(s) (acquired), right foot: Secondary | ICD-10-CM

## 2015-08-26 DIAGNOSIS — Z9889 Other specified postprocedural states: Secondary | ICD-10-CM | POA: Diagnosis not present

## 2015-08-26 DIAGNOSIS — T84498D Other mechanical complication of other internal orthopedic devices, implants and grafts, subsequent encounter: Secondary | ICD-10-CM | POA: Diagnosis not present

## 2015-08-26 DIAGNOSIS — M722 Plantar fascial fibromatosis: Secondary | ICD-10-CM

## 2015-08-26 NOTE — Progress Notes (Signed)
She presents today for follow-up visit regarding her forefoot surgery right date of surgery 05/29/2015. She states that she is doing great problems.  Objective: Vital signs are stable she is alert and oriented 3 she is status post Keller arthroplasty hammertoe repair #2 #3 #4 with screws. There is no erythema saline as drainage or odor there is some hyperpigmentation at the scars but otherwise good range of motion pain free and radiographs confirmed good placement with arthrodesis of the screws.  Assessment: Well-healing surgical foot right.  Plan: We'll allow her to get back to her regular routine and she will follow-up with [place] as needed.

## 2015-09-07 DIAGNOSIS — M9903 Segmental and somatic dysfunction of lumbar region: Secondary | ICD-10-CM | POA: Diagnosis not present

## 2015-09-07 DIAGNOSIS — M9905 Segmental and somatic dysfunction of pelvic region: Secondary | ICD-10-CM | POA: Diagnosis not present

## 2015-09-07 DIAGNOSIS — M5416 Radiculopathy, lumbar region: Secondary | ICD-10-CM | POA: Diagnosis not present

## 2015-09-07 DIAGNOSIS — S39013A Strain of muscle, fascia and tendon of pelvis, initial encounter: Secondary | ICD-10-CM | POA: Diagnosis not present

## 2015-10-05 DIAGNOSIS — M9905 Segmental and somatic dysfunction of pelvic region: Secondary | ICD-10-CM | POA: Diagnosis not present

## 2015-10-05 DIAGNOSIS — S39013A Strain of muscle, fascia and tendon of pelvis, initial encounter: Secondary | ICD-10-CM | POA: Diagnosis not present

## 2015-10-05 DIAGNOSIS — M9903 Segmental and somatic dysfunction of lumbar region: Secondary | ICD-10-CM | POA: Diagnosis not present

## 2015-10-05 DIAGNOSIS — M5416 Radiculopathy, lumbar region: Secondary | ICD-10-CM | POA: Diagnosis not present

## 2015-11-02 DIAGNOSIS — S39013A Strain of muscle, fascia and tendon of pelvis, initial encounter: Secondary | ICD-10-CM | POA: Diagnosis not present

## 2015-11-02 DIAGNOSIS — M5416 Radiculopathy, lumbar region: Secondary | ICD-10-CM | POA: Diagnosis not present

## 2015-11-02 DIAGNOSIS — M9903 Segmental and somatic dysfunction of lumbar region: Secondary | ICD-10-CM | POA: Diagnosis not present

## 2015-11-02 DIAGNOSIS — M9905 Segmental and somatic dysfunction of pelvic region: Secondary | ICD-10-CM | POA: Diagnosis not present

## 2015-11-12 ENCOUNTER — Encounter: Payer: Self-pay | Admitting: General Surgery

## 2015-11-12 ENCOUNTER — Ambulatory Visit (INDEPENDENT_AMBULATORY_CARE_PROVIDER_SITE_OTHER): Payer: PPO | Admitting: General Surgery

## 2015-11-12 VITALS — BP 136/78 | HR 76 | Resp 14 | Ht 65.0 in | Wt 147.0 lb

## 2015-11-12 DIAGNOSIS — N63 Unspecified lump in breast: Secondary | ICD-10-CM | POA: Diagnosis not present

## 2015-11-12 DIAGNOSIS — N6320 Unspecified lump in the left breast, unspecified quadrant: Secondary | ICD-10-CM

## 2015-11-12 NOTE — Patient Instructions (Signed)
Patient to return  as scheduled after her mammogram in six months.

## 2015-11-12 NOTE — Progress Notes (Signed)
Patient ID: Yvonne White, female   DOB: [birth year], 70 y.o.   MRN: [id number]  Chief Complaint  Patient presents with  . Other    left breast mass    HPI Yvonne White is a 70 y.o. female here today for her three month follow up on her shrinking left breast mass. Mass was 1.5cm on initial inspection and had reduce to 1cm on her subsequent visit. She has no other complaints or concerns today. I have reviewed the history of present illness with the patient.   HPI  Past Medical History  Diagnosis Date  . Diverticulosis of colon (without mention of hemorrhage)   . Personal history of tobacco use, presenting hazards to health 2013  . Diffuse cystic mastopathy   . Abnormal mammogram, unspecified   . Arthritis 2005  . Personal history of colonic polyps 2000    large polyp  . Special screening for malignant neoplasms, colon 2013    family history of colon polyp and cancer    Past Surgical History  Procedure Laterality Date  . Abdominal hysterectomy  1991  . Foot surgery  2009,2010, [id number]  . Colonoscopy  2000,2003,2008,2013    Dr. Bonna Gains  . Flexible sigmoidoscopy  2001  . Hand surgery Left 2012    Family History  Problem Relation Age of Onset  . Colon polyps Mother     pre cancerous  . Cancer Maternal Aunt     breast cancer  . Breast cancer Maternal Aunt 46  . Colon polyps Sister     Social History Social History  Substance Use Topics  . Smoking status: Former Smoker -- 20 years    Types: Cigarettes  . Smokeless tobacco: Never Used     Comment: quit 2005  . Alcohol Use: 1.0 oz/week    2 drink(s) per week    No Known Allergies  Current Outpatient Prescriptions  Medication Sig Dispense Refill  . Cholecalciferol (VITAMIN D3) 3000 UNITS TABS Take by mouth as directed.    . Coenzyme Q10 (CO Q 10 PO) Take by mouth.    . Cyanocobalamin (VITAMIN B 12 PO) Take 1 tablet by mouth as directed.     . diclofenac (VOLTAREN) 75 MG EC tablet Take 1 tablet by mouth  daily.    . misoprostol (CYTOTEC) 200 MCG tablet Take 1 tablet by mouth daily.     No current facility-administered medications for this visit.    Review of Systems Review of Systems  Constitutional: Negative.   Respiratory: Negative.   Cardiovascular: Negative.     Blood pressure 136/78, pulse 76, resp. rate 14, height 5\' 5"  (1.651 m), weight 147 lb (66.679 kg).  Physical Exam Physical Exam  Constitutional: She is oriented to person, place, and time. She appears well-developed and well-nourished.  Eyes: Conjunctivae are normal. No scleral icterus.  Neck: Neck supple.  Pulmonary/Chest: Right breast exhibits no inverted nipple, no mass, no nipple discharge, no skin change and no tenderness. Left breast exhibits mass. Left breast exhibits no inverted nipple, no skin change and no tenderness.    Lymphadenopathy:    She has no cervical adenopathy.    She has no axillary adenopathy.  Neurological: She is alert and oriented to person, place, and time.  Skin: Skin is warm and dry.    Data Reviewed Prior notes  Assessment    Left breast mass. Half the size it was on last visit. Ultrasound results of mass were unremarkable. This is likely a benign process and does  not require any intervention.    Plan      Patient to return  as scheduled after her mammogram in six months.   PCP:  Tracie Harrier This information has been scribed by Gaspar Cola CMA.    SANKAR,SEEPLAPUTHUR G 11/12/2015, 3:07 PM

## 2015-11-30 DIAGNOSIS — M9903 Segmental and somatic dysfunction of lumbar region: Secondary | ICD-10-CM | POA: Diagnosis not present

## 2015-11-30 DIAGNOSIS — M9905 Segmental and somatic dysfunction of pelvic region: Secondary | ICD-10-CM | POA: Diagnosis not present

## 2015-11-30 DIAGNOSIS — S39013A Strain of muscle, fascia and tendon of pelvis, initial encounter: Secondary | ICD-10-CM | POA: Diagnosis not present

## 2015-11-30 DIAGNOSIS — M5416 Radiculopathy, lumbar region: Secondary | ICD-10-CM | POA: Diagnosis not present

## 2015-12-29 DIAGNOSIS — M9905 Segmental and somatic dysfunction of pelvic region: Secondary | ICD-10-CM | POA: Diagnosis not present

## 2015-12-29 DIAGNOSIS — S39013A Strain of muscle, fascia and tendon of pelvis, initial encounter: Secondary | ICD-10-CM | POA: Diagnosis not present

## 2015-12-29 DIAGNOSIS — M9903 Segmental and somatic dysfunction of lumbar region: Secondary | ICD-10-CM | POA: Diagnosis not present

## 2015-12-29 DIAGNOSIS — M5416 Radiculopathy, lumbar region: Secondary | ICD-10-CM | POA: Diagnosis not present

## 2016-01-06 DIAGNOSIS — M159 Polyosteoarthritis, unspecified: Secondary | ICD-10-CM | POA: Diagnosis not present

## 2016-01-25 DIAGNOSIS — M9903 Segmental and somatic dysfunction of lumbar region: Secondary | ICD-10-CM | POA: Diagnosis not present

## 2016-01-25 DIAGNOSIS — S39013A Strain of muscle, fascia and tendon of pelvis, initial encounter: Secondary | ICD-10-CM | POA: Diagnosis not present

## 2016-01-25 DIAGNOSIS — M5416 Radiculopathy, lumbar region: Secondary | ICD-10-CM | POA: Diagnosis not present

## 2016-01-25 DIAGNOSIS — M9905 Segmental and somatic dysfunction of pelvic region: Secondary | ICD-10-CM | POA: Diagnosis not present

## 2016-02-23 DIAGNOSIS — M9903 Segmental and somatic dysfunction of lumbar region: Secondary | ICD-10-CM | POA: Diagnosis not present

## 2016-02-23 DIAGNOSIS — M9905 Segmental and somatic dysfunction of pelvic region: Secondary | ICD-10-CM | POA: Diagnosis not present

## 2016-02-23 DIAGNOSIS — S39013A Strain of muscle, fascia and tendon of pelvis, initial encounter: Secondary | ICD-10-CM | POA: Diagnosis not present

## 2016-02-23 DIAGNOSIS — M5416 Radiculopathy, lumbar region: Secondary | ICD-10-CM | POA: Diagnosis not present

## 2016-03-01 DIAGNOSIS — Z0000 Encounter for general adult medical examination without abnormal findings: Secondary | ICD-10-CM | POA: Diagnosis not present

## 2016-03-01 DIAGNOSIS — M159 Polyosteoarthritis, unspecified: Secondary | ICD-10-CM | POA: Diagnosis not present

## 2016-03-01 DIAGNOSIS — D72819 Decreased white blood cell count, unspecified: Secondary | ICD-10-CM | POA: Diagnosis not present

## 2016-03-01 DIAGNOSIS — Z23 Encounter for immunization: Secondary | ICD-10-CM | POA: Diagnosis not present

## 2016-03-01 DIAGNOSIS — Z780 Asymptomatic menopausal state: Secondary | ICD-10-CM | POA: Diagnosis not present

## 2016-03-01 DIAGNOSIS — M4126 Other idiopathic scoliosis, lumbar region: Secondary | ICD-10-CM | POA: Diagnosis not present

## 2016-03-08 DIAGNOSIS — M419 Scoliosis, unspecified: Secondary | ICD-10-CM | POA: Diagnosis not present

## 2016-03-08 DIAGNOSIS — Z0000 Encounter for general adult medical examination without abnormal findings: Secondary | ICD-10-CM | POA: Diagnosis not present

## 2016-03-08 DIAGNOSIS — M159 Polyosteoarthritis, unspecified: Secondary | ICD-10-CM | POA: Diagnosis not present

## 2016-03-08 DIAGNOSIS — R7989 Other specified abnormal findings of blood chemistry: Secondary | ICD-10-CM | POA: Diagnosis not present

## 2016-03-21 DIAGNOSIS — M9903 Segmental and somatic dysfunction of lumbar region: Secondary | ICD-10-CM | POA: Diagnosis not present

## 2016-03-21 DIAGNOSIS — M5416 Radiculopathy, lumbar region: Secondary | ICD-10-CM | POA: Diagnosis not present

## 2016-03-21 DIAGNOSIS — S39013A Strain of muscle, fascia and tendon of pelvis, initial encounter: Secondary | ICD-10-CM | POA: Diagnosis not present

## 2016-03-21 DIAGNOSIS — M9905 Segmental and somatic dysfunction of pelvic region: Secondary | ICD-10-CM | POA: Diagnosis not present

## 2016-04-04 ENCOUNTER — Other Ambulatory Visit: Payer: Self-pay

## 2016-04-04 DIAGNOSIS — Z1231 Encounter for screening mammogram for malignant neoplasm of breast: Secondary | ICD-10-CM

## 2016-05-09 DIAGNOSIS — M9903 Segmental and somatic dysfunction of lumbar region: Secondary | ICD-10-CM | POA: Diagnosis not present

## 2016-05-09 DIAGNOSIS — M9905 Segmental and somatic dysfunction of pelvic region: Secondary | ICD-10-CM | POA: Diagnosis not present

## 2016-05-09 DIAGNOSIS — M5416 Radiculopathy, lumbar region: Secondary | ICD-10-CM | POA: Diagnosis not present

## 2016-05-09 DIAGNOSIS — S39013A Strain of muscle, fascia and tendon of pelvis, initial encounter: Secondary | ICD-10-CM | POA: Diagnosis not present

## 2016-05-31 ENCOUNTER — Ambulatory Visit
Admission: RE | Admit: 2016-05-31 | Discharge: 2016-05-31 | Disposition: A | Discharge status: Home / Self Care | Payer: PPO | Source: Ambulatory Visit | Attending: General Surgery | Admitting: General Surgery

## 2016-05-31 DIAGNOSIS — Z1231 Encounter for screening mammogram for malignant neoplasm of breast: Secondary | ICD-10-CM

## 2016-06-06 DIAGNOSIS — M9905 Segmental and somatic dysfunction of pelvic region: Secondary | ICD-10-CM | POA: Diagnosis not present

## 2016-06-06 DIAGNOSIS — M9903 Segmental and somatic dysfunction of lumbar region: Secondary | ICD-10-CM | POA: Diagnosis not present

## 2016-06-06 DIAGNOSIS — S39013A Strain of muscle, fascia and tendon of pelvis, initial encounter: Secondary | ICD-10-CM | POA: Diagnosis not present

## 2016-06-06 DIAGNOSIS — M5416 Radiculopathy, lumbar region: Secondary | ICD-10-CM | POA: Diagnosis not present

## 2016-06-09 ENCOUNTER — Ambulatory Visit: Payer: PPO | Admitting: General Surgery

## 2016-06-15 ENCOUNTER — Ambulatory Visit (INDEPENDENT_AMBULATORY_CARE_PROVIDER_SITE_OTHER): Payer: PPO | Admitting: General Surgery

## 2016-06-15 ENCOUNTER — Encounter: Payer: Self-pay | Admitting: General Surgery

## 2016-06-15 VITALS — BP 128/74 | HR 91 | Resp 12 | Ht 64.0 in | Wt 139.0 lb

## 2016-06-15 DIAGNOSIS — N6012 Diffuse cystic mastopathy of left breast: Secondary | ICD-10-CM

## 2016-06-15 DIAGNOSIS — N6011 Diffuse cystic mastopathy of right breast: Secondary | ICD-10-CM

## 2016-06-15 DIAGNOSIS — Z86010 Personal history of colonic polyps: Secondary | ICD-10-CM | POA: Diagnosis not present

## 2016-06-15 DIAGNOSIS — H25813 Combined forms of age-related cataract, bilateral: Secondary | ICD-10-CM | POA: Diagnosis not present

## 2016-06-15 DIAGNOSIS — Z8371 Family history of colonic polyps: Secondary | ICD-10-CM | POA: Diagnosis not present

## 2016-06-15 MED ORDER — POLYETHYLENE GLYCOL 3350 17 GM/SCOOP PO POWD: ORAL | 0 refills | Status: DC

## 2016-06-15 NOTE — Patient Instructions (Signed)
The patient is aware to call back for any questions or concerns.  Colonoscopy, Adult A colonoscopy is an exam to look at the entire large intestine. During the exam, a lubricated, bendable tube is inserted into the anus and then passed into the rectum, colon, and other parts of the large intestine. A colonoscopy is often done as a part of normal colorectal screening or in response to certain symptoms, such as anemia, persistent diarrhea, abdominal pain, and blood in the stool. The exam can help screen for and diagnose medical problems, including:  Tumors.  Polyps.  Inflammation.  Areas of bleeding. Tell a health care provider about:  Any allergies you have.  All medicines you are taking, including vitamins, herbs, eye drops, creams, and over-the-counter medicines.  Any problems you or family members have had with anesthetic medicines.  Any blood disorders you have.  Any surgeries you have had.  Any medical conditions you have.  Any problems you have had passing stool. What are the risks? Generally, this is a safe procedure. However, problems may occur, including:  Bleeding.  A tear in the intestine.  A reaction to medicines given during the exam.  Infection (rare). What happens before the procedure? Eating and drinking restrictions  Follow instructions from your health care provider about eating and drinking, which may include:  A few days before the procedure - follow a low-fiber diet. Avoid nuts, seeds, dried fruit, raw fruits, and vegetables.  1-3 days before the procedure - follow a clear liquid diet. Drink only clear liquids, such as clear broth or bouillon, black coffee or tea, clear juice, clear soft drinks or sports drinks, gelatin desert, and popsicles. Avoid any liquids that contain red or purple dye.  On the day of the procedure - do not eat or drink anything during the 2 hours before the procedure, or within the time period that your health care provider  recommends. Bowel prep  If you were prescribed an oral bowel prep to clean out your colon:  Take it as told by your health care provider. Starting the day before your procedure, you will need to drink a large amount of medicated liquid. The liquid will cause you to have multiple loose stools until your stool is almost clear or light green.  If your skin or anus gets irritated from diarrhea, you may use these to relieve the irritation:  Medicated wipes, such as adult wet wipes with aloe and vitamin E.  A skin soothing-product like petroleum jelly.  If you vomit while drinking the bowel prep, take a break for up to 60 minutes and then begin the bowel prep again. If vomiting continues and you cannot take the bowel prep without vomiting, call your health care provider. General instructions  Ask your health care provider about changing or stopping your regular medicines. This is especially important if you are taking diabetes medicines or blood thinners.  Plan to have someone take you home from the hospital or clinic. What happens during the procedure?  An IV tube may be inserted into one of your veins.  You will be given medicine to help you relax (sedative).  To reduce your risk of infection:  Your health care team will wash or sanitize their hands.  Your anal area will be washed with soap.  You will be asked to lie on your side with your knees bent.  Your health care provider will lubricate a long, thin, flexible tube. The tube will have a camera and a light  on the end.  The tube will be inserted into your anus.  The tube will be gently eased through your rectum and colon.  Air will be delivered into your colon to keep it open. You may feel some pressure or cramping.  The camera will be used to take images during the procedure.  A small tissue sample may be removed from your body to be examined under a microscope (biopsy). If any potential problems are found, the tissue will  be sent to a lab for testing.  If small polyps are found, your health care provider may remove them and have them checked for cancer cells.  The tube that was inserted into your anus will be slowly removed. The procedure may vary among health care providers and hospitals. What happens after the procedure?  Your blood pressure, heart rate, breathing rate, and blood oxygen level will be monitored until the medicines you were given have worn off.  Do not drive for 24 hours after the exam.  You may have a small amount of blood in your stool.  You may pass gas and have mild abdominal cramping or bloating due to the air that was used to inflate your colon during the exam.  It is up to you to get the results of your procedure. Ask your health care provider, or the department performing the procedure, when your results will be ready. This information is not intended to replace advice given to you by your health care provider. Make sure you discuss any questions you have with your health care provider. Document Released: 04/08/2000 Document Revised: 10/30/2015 Document Reviewed: 06/23/2015 Elsevier Interactive Patient Education  2017 [employer].

## 2016-06-15 NOTE — Progress Notes (Signed)
Patient ID: Yvonne White, female   DOB: 08/17/1945, 70 y.o.   MRN: [id number]  Chief Complaint  Patient presents with  . Follow-up    HPI Yvonne White is a 70 y.o. female who presents for a breast evaluation. The most recent mammogram was done on 05/31/2016  Patient does perform regular self breast checks and gets regular mammograms done.   No new breast issues. I have reviewed the history of present illness with the patient.  HPI  Past Medical History:  Diagnosis Date  . Abnormal mammogram, unspecified   . Arthritis 2005  . Diffuse cystic mastopathy   . Diverticulosis of colon (without mention of hemorrhage)   . Personal history of colonic polyps 2000   large polyp  . Personal history of tobacco use, presenting hazards to health 2013  . Special screening for malignant neoplasms, colon 2013   family history of colon polyp and cancer    Past Surgical History:  Procedure Laterality Date  . ABDOMINAL HYSTERECTOMY  1991  . COLONOSCOPY  2000,2003,2008,2013   Dr. Alila Sotero-2013  . FLEXIBLE SIGMOIDOSCOPY  2001  . FOOT SURGERY  2009,2010, 2014,2017  . HAND SURGERY Left 2012    Family History  Problem Relation Age of Onset  . Colon polyps Mother     pre cancerous  . Cancer Maternal Aunt     breast cancer  . Breast cancer Maternal Aunt 50  . Colon polyps Sister     Social History Social History  Substance Use Topics  . Smoking status: Former Smoker    Years: 20.00    Types: Cigarettes  . Smokeless tobacco: Never Used     Comment: quit 2005  . Alcohol use 1.0 oz/week    2 drink(s) per week    No Known Allergies  Current Outpatient Prescriptions  Medication Sig Dispense Refill  . Cholecalciferol (VITAMIN D3) 3000 UNITS TABS Take by mouth as directed.    . Coenzyme Q10 (CO Q 10 PO) Take by mouth.    . Cyanocobalamin (VITAMIN B 12 PO) Take 1 tablet by mouth as directed.     . diclofenac (VOLTAREN) 75 MG EC tablet Take 1 tablet by mouth daily.    . misoprostol  (CYTOTEC) 200 MCG tablet Take 1 tablet by mouth daily.    . polyethylene glycol powder (GLYCOLAX/MIRALAX) powder 255 grams one bottle for colonoscopy prep 255 g 0   No current facility-administered medications for this visit.     Review of Systems Review of Systems  Constitutional: Negative.   Respiratory: Negative.   Cardiovascular: Negative.     Blood pressure 128/74, pulse 91, resp. rate 12, height 5' 4" (1.626 m), weight 139 lb (63 kg).  Physical Exam Physical Exam  Constitutional: She is oriented to person, place, and time. She appears well-developed and well-nourished.  HENT:  Mouth/Throat: Oropharynx is clear and moist.  Eyes: Conjunctivae are normal. No scleral icterus.  Neck: Neck supple.  Cardiovascular: Normal rate, regular rhythm and normal heart sounds.   Pulmonary/Chest: Effort normal and breath sounds normal. Right breast exhibits no inverted nipple, no mass, no nipple discharge, no skin change and no tenderness. Left breast exhibits no inverted nipple, no mass, no nipple discharge, no skin change and no tenderness.  Abdominal: Soft. Bowel sounds are normal. There is no tenderness.  Lymphadenopathy:    She has no cervical adenopathy.    She has no axillary adenopathy.  Neurological: She is alert and oriented to person, place, and time.  Skin:   Skin is warm and dry.  Psychiatric: Her behavior is normal.    Data Reviewed Mammogram reviewed  Assessment    FCD History colon polyps. Family history colon polyps    Plan    Continue annual mammograms with Dr Hande- PCP.  Colonoscopy with possible biopsy/polypectomy prn: Information regarding the procedure, including its potential risks and complications (including but not limited to perforation of the bowel, which may require emergency surgery to repair, and bleeding) was verbally given to the patient. Educational information regarding lower intestinal endoscopy was given to the patient. Written instructions for  how to complete the bowel prep using Miralax were provided. The importance of drinking ample fluids to avoid dehydration as a result of the prep emphasized.     Patient has been scheduled for a colonoscopy on 07-06-16 at [facility].  This information has been scribed by Jessica Qualls CMA.   Saliha Salts G 06/16/2016, 10:44 AM   

## 2016-07-01 ENCOUNTER — Other Ambulatory Visit: Payer: Self-pay | Admitting: General Surgery

## 2016-07-06 ENCOUNTER — Ambulatory Visit: Payer: PPO | Admitting: Certified Registered"

## 2016-07-06 ENCOUNTER — Encounter
Admission: RE | Disposition: A | Discharge status: Home / Self Care | Payer: Self-pay | Source: Ambulatory Visit | Attending: General Surgery

## 2016-07-06 ENCOUNTER — Encounter: Payer: Self-pay | Admitting: *Deleted

## 2016-07-06 ENCOUNTER — Ambulatory Visit
Admission: RE | Admit: 2016-07-06 | Discharge: 2016-07-06 | Disposition: A | Discharge status: Home / Self Care | Payer: PPO | Source: Ambulatory Visit | Attending: General Surgery | Admitting: General Surgery

## 2016-07-06 DIAGNOSIS — Z86010 Personal history of colonic polyps: Secondary | ICD-10-CM | POA: Insufficient documentation

## 2016-07-06 DIAGNOSIS — Z8371 Family history of colonic polyps: Secondary | ICD-10-CM | POA: Insufficient documentation

## 2016-07-06 DIAGNOSIS — M1990 Unspecified osteoarthritis, unspecified site: Secondary | ICD-10-CM | POA: Insufficient documentation

## 2016-07-06 DIAGNOSIS — Z1211 Encounter for screening for malignant neoplasm of colon: Secondary | ICD-10-CM | POA: Insufficient documentation

## 2016-07-06 DIAGNOSIS — Z800 Family history of malignant neoplasm of digestive organs: Secondary | ICD-10-CM | POA: Insufficient documentation

## 2016-07-06 DIAGNOSIS — K5730 Diverticulosis of large intestine without perforation or abscess without bleeding: Secondary | ICD-10-CM | POA: Insufficient documentation

## 2016-07-06 DIAGNOSIS — Z87891 Personal history of nicotine dependence: Secondary | ICD-10-CM | POA: Insufficient documentation

## 2016-07-06 DIAGNOSIS — Z86011 Personal history of benign neoplasm of the brain: Secondary | ICD-10-CM | POA: Diagnosis not present

## 2016-07-06 DIAGNOSIS — K5790 Diverticulosis of intestine, part unspecified, without perforation or abscess without bleeding: Secondary | ICD-10-CM | POA: Diagnosis not present

## 2016-07-06 HISTORY — PX: COLONOSCOPY WITH PROPOFOL: SHX5780

## 2016-07-06 SURGERY — COLONOSCOPY WITH PROPOFOL
Anesthesia: General

## 2016-07-06 MED ORDER — SODIUM CHLORIDE 0.9 % IV SOLN
INTRAVENOUS | Status: DC
  Administered 2016-07-06: 1000 mL via INTRAVENOUS

## 2016-07-06 MED ORDER — LIDOCAINE HCL (PF) 1 % IJ SOLN
2.0000 mL | Freq: Once | INTRAMUSCULAR | Status: AC
  Administered 2016-07-06: 0.3 mL via INTRADERMAL

## 2016-07-06 MED ORDER — PROPOFOL 10 MG/ML IV BOLUS
INTRAVENOUS | Status: DC | PRN
  Administered 2016-07-06: 30 mg via INTRAVENOUS
  Administered 2016-07-06: 70 mg via INTRAVENOUS

## 2016-07-06 MED ORDER — LIDOCAINE HCL (PF) 1 % IJ SOLN
INTRAMUSCULAR | Status: AC
  Administered 2016-07-06: 0.3 mL via INTRADERMAL
  Filled 2016-07-06: qty 2

## 2016-07-06 MED ORDER — PHENYLEPHRINE HCL 10 MG/ML IJ SOLN
INTRAMUSCULAR | Status: DC | PRN
  Administered 2016-07-06: 100 ug via INTRAVENOUS

## 2016-07-06 MED ORDER — PROPOFOL 500 MG/50ML IV EMUL
INTRAVENOUS | Status: DC | PRN
  Administered 2016-07-06: 120 ug/kg/min via INTRAVENOUS

## 2016-07-06 MED ORDER — LIDOCAINE 2% (20 MG/ML) 5 ML SYRINGE
INTRAMUSCULAR | Status: DC | PRN
  Administered 2016-07-06: 25 mg via INTRAVENOUS

## 2016-07-06 MED ORDER — PROPOFOL 500 MG/50ML IV EMUL
INTRAVENOUS | Status: AC
  Filled 2016-07-06: qty 50

## 2016-07-06 NOTE — H&P (View-Only) (Signed)
Patient ID: Yvonne White, female   DOB: 1946/02/16, 71 y.o.   MRN: [id number]  Chief Complaint  Patient presents with  . Follow-up    HPI Yvonne White is a 71 y.o. female who presents for a breast evaluation. The most recent mammogram was done on 05/31/2016  Patient does perform regular self breast checks and gets regular mammograms done.   No new breast issues. I have reviewed the history of present illness with the patient.  HPI  Past Medical History:  Diagnosis Date  . Abnormal mammogram, unspecified   . Arthritis 2005  . Diffuse cystic mastopathy   . Diverticulosis of colon (without mention of hemorrhage)   . Personal history of colonic polyps 2000   large polyp  . Personal history of tobacco use, presenting hazards to health 2013  . Special screening for malignant neoplasms, colon 2013   family history of colon polyp and cancer    Past Surgical History:  Procedure Laterality Date  . ABDOMINAL HYSTERECTOMY  1991  . COLONOSCOPY  [id number]   Dr. Bonna Gains  . FLEXIBLE SIGMOIDOSCOPY  2001  . FOOT SURGERY  2009,2010, [id number]  . HAND SURGERY Left 2012    Family History  Problem Relation Age of Onset  . Colon polyps Mother     pre cancerous  . Cancer Maternal Aunt     breast cancer  . Breast cancer Maternal Aunt 73  . Colon polyps Sister     Social History Social History  Substance Use Topics  . Smoking status: Former Smoker    Years: 20.00    Types: Cigarettes  . Smokeless tobacco: Never Used     Comment: quit 2005  . Alcohol use 1.0 oz/week    2 drink(s) per week    No Known Allergies  Current Outpatient Prescriptions  Medication Sig Dispense Refill  . Cholecalciferol (VITAMIN D3) 3000 UNITS TABS Take by mouth as directed.    . Coenzyme Q10 (CO Q 10 PO) Take by mouth.    . Cyanocobalamin (VITAMIN B 12 PO) Take 1 tablet by mouth as directed.     . diclofenac (VOLTAREN) 75 MG EC tablet Take 1 tablet by mouth daily.    . misoprostol  (CYTOTEC) 200 MCG tablet Take 1 tablet by mouth daily.    . polyethylene glycol powder (GLYCOLAX/MIRALAX) powder 255 grams one bottle for colonoscopy prep 255 g 0   No current facility-administered medications for this visit.     Review of Systems Review of Systems  Constitutional: Negative.   Respiratory: Negative.   Cardiovascular: Negative.     Blood pressure 128/74, pulse 91, resp. rate 12, height 5\' 4"  (1.626 m), weight 139 lb (63 kg).  Physical Exam Physical Exam  Constitutional: She is oriented to person, place, and time. She appears well-developed and well-nourished.  HENT:  Mouth/Throat: Oropharynx is clear and moist.  Eyes: Conjunctivae are normal. No scleral icterus.  Neck: Neck supple.  Cardiovascular: Normal rate, regular rhythm and normal heart sounds.   Pulmonary/Chest: Effort normal and breath sounds normal. Right breast exhibits no inverted nipple, no mass, no nipple discharge, no skin change and no tenderness. Left breast exhibits no inverted nipple, no mass, no nipple discharge, no skin change and no tenderness.  Abdominal: Soft. Bowel sounds are normal. There is no tenderness.  Lymphadenopathy:    She has no cervical adenopathy.    She has no axillary adenopathy.  Neurological: She is alert and oriented to person, place, and time.  Skin:  Skin is warm and dry.  Psychiatric: Her behavior is normal.    Data Reviewed Mammogram reviewed  Assessment    FCD History colon polyps. Family history colon polyps    Plan    Continue annual mammograms with Dr Ginette Pitman- PCP.  Colonoscopy with possible biopsy/polypectomy prn: Information regarding the procedure, including its potential risks and complications (including but not limited to perforation of the bowel, which may require emergency surgery to repair, and bleeding) was verbally given to the patient. Educational information regarding lower intestinal endoscopy was given to the patient. Written instructions for  how to complete the bowel prep using Miralax were provided. The importance of drinking ample fluids to avoid dehydration as a result of the prep emphasized.     Patient has been scheduled for a colonoscopy on 07-06-16 at [facility].  This information has been scribed by Gaspar Cola CMA.   SANKAR,SEEPLAPUTHUR G 06/16/2016, 10:44 AM

## 2016-07-06 NOTE — Anesthesia Preprocedure Evaluation (Signed)
Anesthesia Evaluation  Patient identified by MRN, date of birth, ID band Patient awake    Reviewed: Allergy & Precautions, NPO status , Patient's Chart, lab work & pertinent test results  History of Anesthesia Complications Negative for: history of anesthetic complications  Airway Mallampati: II       Dental   Pulmonary neg pulmonary ROS, former smoker,           Cardiovascular negative cardio ROS       Neuro/Psych negative neurological ROS     GI/Hepatic negative GI ROS, Neg liver ROS,   Endo/Other  negative endocrine ROS  Renal/GU negative Renal ROS     Musculoskeletal   Abdominal   Peds  Hematology negative hematology ROS (+)   Anesthesia Other Findings   Reproductive/Obstetrics                             Anesthesia Physical Anesthesia Plan  ASA: I  Anesthesia Plan: General   Post-op Pain Management:    Induction: Intravenous  Airway Management Planned: Nasal Cannula  Additional Equipment:   Intra-op Plan:   Post-operative Plan:   Informed Consent: I have reviewed the patients History and Physical, chart, labs and discussed the procedure including the risks, benefits and alternatives for the proposed anesthesia with the patient or authorized representative who has indicated his/her understanding and acceptance.     Plan Discussed with:   Anesthesia Plan Comments:         Anesthesia Quick Evaluation

## 2016-07-06 NOTE — Anesthesia Postprocedure Evaluation (Signed)
Anesthesia Post Note  Patient: Yvonne White  Procedure(s) Performed: Procedure(s) (LRB): COLONOSCOPY WITH PROPOFOL (N/A)  Patient location during evaluation: Endoscopy Anesthesia Type: General Level of consciousness: awake and alert Pain management: pain level controlled Vital Signs Assessment: post-procedure vital signs reviewed and stable Respiratory status: spontaneous breathing and respiratory function stable Cardiovascular status: stable Anesthetic complications: no     Last Vitals:  Vitals:   07/06/16 0813 07/06/16 0955  BP: 106/82 109/72  Pulse: 82   Resp: 17 15  Temp: (!) 35.8 C 36.1 C    Last Pain: There were no vitals filed for this visit.               KEPHART,WILLIAM K

## 2016-07-06 NOTE — Interval H&P Note (Signed)
History and Physical Interval Note:  07/06/2016 8:26 AM  Yvonne White  has presented today for surgery, with the diagnosis of HX COLON POLYPS  The various methods of treatment have been discussed with the patient and family. After consideration of risks, benefits and other options for treatment, the patient has consented to  Procedure(s): COLONOSCOPY WITH PROPOFOL (N/A) as a surgical intervention .  The patient's history has been reviewed, patient examined, no change in status, stable for surgery.  I have reviewed the patient's chart and labs.  Questions were answered to the patient's satisfaction.     Tamu Golz G

## 2016-07-06 NOTE — Transfer of Care (Signed)
Immediate Anesthesia Transfer of Care Note  Patient: Yvonne White  Procedure(s) Performed: Procedure(s): COLONOSCOPY WITH PROPOFOL (N/A)  Patient Location: Endoscopy Unit  Anesthesia Type:General  Level of Consciousness: awake and alert   Airway & Oxygen Therapy: Patient Spontanous Breathing and Patient connected to nasal cannula oxygen  Post-op Assessment: Report given to RN and Post -op Vital signs reviewed and stable  Post vital signs: Reviewed  Last Vitals:  Vitals:   07/06/16 0813 07/06/16 0955  BP: 106/82 109/72  Pulse: 82   Resp: 17 15  Temp: (!) 35.8 C 36.1 C    Last Pain: There were no vitals filed for this visit.       Complications: No apparent anesthesia complications

## 2016-07-06 NOTE — Anesthesia Post-op Follow-up Note (Cosign Needed)
Anesthesia QCDR form completed.        

## 2016-07-06 NOTE — Op Note (Signed)
[facility] Gastroenterology Patient Name: Yvonne White Procedure Date: 07/06/2016 8:37 AM MRN: [id number] Account #: [id number] Date of Birth: 1945-05-13 Admit Type: Outpatient Age: 71 Room: [facility] ENDO ROOM 1 Gender: Female Note Status: Finalized Procedure:            Colonoscopy Indications:          Surveillance: Personal history of adenomatous polyps on                        last colonoscopy 5 years ago Providers:            Cherylin Waguespack G. Jamal Collin, MD Referring MD:         Tracie Harrier, MD (Referring MD) Medicines:            General Anesthesia Complications:        No immediate complications. Procedure:            Pre-Anesthesia Assessment:                       - Using IV propofol under the supervision of an                        anesthesiologist was determined to be medically                        necessary for this procedure based on review of the                        patient's medical history, medications, and prior                        anesthesia history.                       After obtaining informed consent, the colonoscope was                        passed under direct vision. Throughout the procedure,                        the patient's blood pressure, pulse, and oxygen                        saturations were monitored continuously. The                        Colonoscope was introduced through the anus with the                        intention of advancing to the cecum. The scope was                        advanced to the sigmoid colon before the procedure was                        aborted. Medications were given. The colonoscopy was                        technically difficult and complex due to unusual  anatomy. At 45 cm level the colon would not distend. In                        spite of position changes and abdominal pressure this                        area would not distend nor hold any air. procedure was                      therefor terminated at hat level. The patient tolerated                        the procedure well. The quality of the bowel                        preparation was good. Findings:      The perianal and digital rectal examinations were normal.      Multiple small and large-mouthed diverticula were found in the sigmoid       colon.      The exam was otherwise without abnormality on direct and retroflexion       views. Impression:           - Severe diverticulosis in the sigmoid colon.                       - The examination was otherwise normal on direct and                        retroflexion views.                       - No specimens collected. Recommendation:       - Discharge patient to home.                       - Resume previous diet.                       - Continue present medications.                       - Perform an air contrast barium enema at appointment                        to be scheduled. Procedure Code(s):    --- Professional ---                       [phone], 8, Colonoscopy, flexible; diagnostic, including                        collection of specimen(s) by brushing or washing, when                        performed (separate procedure) Diagnosis Code(s):    --- Professional ---                       Z86.010, Personal history of colonic polyps                       K57.30, Diverticulosis of large intestine without  perforation or abscess without bleeding CPT copyright 2016 American Medical Association. All rights reserved. The codes documented in this report are preliminary and upon coder review may  be revised to meet current compliance requirements. Christene Lye, MD 07/06/2016 10:03:05 AM This report has been signed electronically. Number of Addenda: 0 Note Initiated On: 07/06/2016 8:37 AM Total Procedure Duration: 1 hour 6 minutes 17 seconds       [facility]

## 2016-07-07 ENCOUNTER — Encounter: Payer: Self-pay | Admitting: General Surgery

## 2016-07-07 ENCOUNTER — Telehealth: Payer: Self-pay

## 2016-07-07 ENCOUNTER — Other Ambulatory Visit: Payer: Self-pay

## 2016-07-07 DIAGNOSIS — Z86010 Personal history of colonic polyps: Secondary | ICD-10-CM

## 2016-07-07 NOTE — Telephone Encounter (Signed)
Call to patient to see about scheduling a Barium Enema with Air Contrast. She is amendable to this.  The patient is scheduled for this procedure at [facility] on 07/12/16 at 8:00 am. She will need to arrive at 7:45 am and have nothing to eat or drink after midnight. She will need to pick up a prep kit for this. The patient is aware of date, time, and instructions.

## 2016-07-07 NOTE — Telephone Encounter (Signed)
The patient's procedure has been rescheduled at [facility] to 07/26/16 at 10:00 am. She will arrive by 9:45 am and have nothing to eat or drink after midnight. She will pick up a prep kit for this. The patient is aware of date, time, and instructions.

## 2016-07-11 DIAGNOSIS — M9903 Segmental and somatic dysfunction of lumbar region: Secondary | ICD-10-CM | POA: Diagnosis not present

## 2016-07-11 DIAGNOSIS — M5416 Radiculopathy, lumbar region: Secondary | ICD-10-CM | POA: Diagnosis not present

## 2016-07-11 DIAGNOSIS — S39013A Strain of muscle, fascia and tendon of pelvis, initial encounter: Secondary | ICD-10-CM | POA: Diagnosis not present

## 2016-07-11 DIAGNOSIS — M9905 Segmental and somatic dysfunction of pelvic region: Secondary | ICD-10-CM | POA: Diagnosis not present

## 2016-07-12 ENCOUNTER — Ambulatory Visit: Payer: PPO

## 2016-07-25 DIAGNOSIS — M9903 Segmental and somatic dysfunction of lumbar region: Secondary | ICD-10-CM | POA: Diagnosis not present

## 2016-07-25 DIAGNOSIS — M9905 Segmental and somatic dysfunction of pelvic region: Secondary | ICD-10-CM | POA: Diagnosis not present

## 2016-07-25 DIAGNOSIS — S39013A Strain of muscle, fascia and tendon of pelvis, initial encounter: Secondary | ICD-10-CM | POA: Diagnosis not present

## 2016-07-25 DIAGNOSIS — M5416 Radiculopathy, lumbar region: Secondary | ICD-10-CM | POA: Diagnosis not present

## 2016-07-26 ENCOUNTER — Ambulatory Visit: Payer: PPO

## 2016-07-28 DIAGNOSIS — M5416 Radiculopathy, lumbar region: Secondary | ICD-10-CM | POA: Diagnosis not present

## 2016-07-28 DIAGNOSIS — S39013A Strain of muscle, fascia and tendon of pelvis, initial encounter: Secondary | ICD-10-CM | POA: Diagnosis not present

## 2016-07-28 DIAGNOSIS — M9903 Segmental and somatic dysfunction of lumbar region: Secondary | ICD-10-CM | POA: Diagnosis not present

## 2016-07-28 DIAGNOSIS — M9905 Segmental and somatic dysfunction of pelvic region: Secondary | ICD-10-CM | POA: Diagnosis not present

## 2016-08-01 DIAGNOSIS — S39013A Strain of muscle, fascia and tendon of pelvis, initial encounter: Secondary | ICD-10-CM | POA: Diagnosis not present

## 2016-08-01 DIAGNOSIS — M9903 Segmental and somatic dysfunction of lumbar region: Secondary | ICD-10-CM | POA: Diagnosis not present

## 2016-08-01 DIAGNOSIS — M9905 Segmental and somatic dysfunction of pelvic region: Secondary | ICD-10-CM | POA: Diagnosis not present

## 2016-08-01 DIAGNOSIS — M5416 Radiculopathy, lumbar region: Secondary | ICD-10-CM | POA: Diagnosis not present

## 2016-08-08 DIAGNOSIS — M9905 Segmental and somatic dysfunction of pelvic region: Secondary | ICD-10-CM | POA: Diagnosis not present

## 2016-08-08 DIAGNOSIS — M5416 Radiculopathy, lumbar region: Secondary | ICD-10-CM | POA: Diagnosis not present

## 2016-08-08 DIAGNOSIS — M9903 Segmental and somatic dysfunction of lumbar region: Secondary | ICD-10-CM | POA: Diagnosis not present

## 2016-08-08 DIAGNOSIS — S39013A Strain of muscle, fascia and tendon of pelvis, initial encounter: Secondary | ICD-10-CM | POA: Diagnosis not present

## 2016-08-11 ENCOUNTER — Ambulatory Visit
Admission: RE | Admit: 2016-08-11 | Discharge: 2016-08-11 | Disposition: A | Discharge status: Home / Self Care | Payer: PPO | Source: Ambulatory Visit | Attending: General Surgery | Admitting: General Surgery

## 2016-08-11 DIAGNOSIS — Z86010 Personal history of colonic polyps: Secondary | ICD-10-CM | POA: Diagnosis not present

## 2016-08-11 DIAGNOSIS — K5730 Diverticulosis of large intestine without perforation or abscess without bleeding: Secondary | ICD-10-CM | POA: Insufficient documentation

## 2016-08-22 DIAGNOSIS — M9905 Segmental and somatic dysfunction of pelvic region: Secondary | ICD-10-CM | POA: Diagnosis not present

## 2016-08-22 DIAGNOSIS — S39013A Strain of muscle, fascia and tendon of pelvis, initial encounter: Secondary | ICD-10-CM | POA: Diagnosis not present

## 2016-08-22 DIAGNOSIS — M5416 Radiculopathy, lumbar region: Secondary | ICD-10-CM | POA: Diagnosis not present

## 2016-08-22 DIAGNOSIS — M9903 Segmental and somatic dysfunction of lumbar region: Secondary | ICD-10-CM | POA: Diagnosis not present

## 2016-09-05 DIAGNOSIS — M9905 Segmental and somatic dysfunction of pelvic region: Secondary | ICD-10-CM | POA: Diagnosis not present

## 2016-09-05 DIAGNOSIS — S39013A Strain of muscle, fascia and tendon of pelvis, initial encounter: Secondary | ICD-10-CM | POA: Diagnosis not present

## 2016-09-05 DIAGNOSIS — M5416 Radiculopathy, lumbar region: Secondary | ICD-10-CM | POA: Diagnosis not present

## 2016-09-05 DIAGNOSIS — M9903 Segmental and somatic dysfunction of lumbar region: Secondary | ICD-10-CM | POA: Diagnosis not present

## 2016-09-20 DIAGNOSIS — M9905 Segmental and somatic dysfunction of pelvic region: Secondary | ICD-10-CM | POA: Diagnosis not present

## 2016-09-20 DIAGNOSIS — S39013A Strain of muscle, fascia and tendon of pelvis, initial encounter: Secondary | ICD-10-CM | POA: Diagnosis not present

## 2016-09-20 DIAGNOSIS — M9903 Segmental and somatic dysfunction of lumbar region: Secondary | ICD-10-CM | POA: Diagnosis not present

## 2016-09-20 DIAGNOSIS — M5416 Radiculopathy, lumbar region: Secondary | ICD-10-CM | POA: Diagnosis not present

## 2016-09-21 DIAGNOSIS — M9905 Segmental and somatic dysfunction of pelvic region: Secondary | ICD-10-CM | POA: Diagnosis not present

## 2016-09-21 DIAGNOSIS — S39013A Strain of muscle, fascia and tendon of pelvis, initial encounter: Secondary | ICD-10-CM | POA: Diagnosis not present

## 2016-09-21 DIAGNOSIS — M5416 Radiculopathy, lumbar region: Secondary | ICD-10-CM | POA: Diagnosis not present

## 2016-09-21 DIAGNOSIS — M9903 Segmental and somatic dysfunction of lumbar region: Secondary | ICD-10-CM | POA: Diagnosis not present

## 2016-09-23 DIAGNOSIS — M9903 Segmental and somatic dysfunction of lumbar region: Secondary | ICD-10-CM | POA: Diagnosis not present

## 2016-09-23 DIAGNOSIS — S39013A Strain of muscle, fascia and tendon of pelvis, initial encounter: Secondary | ICD-10-CM | POA: Diagnosis not present

## 2016-09-23 DIAGNOSIS — M9905 Segmental and somatic dysfunction of pelvic region: Secondary | ICD-10-CM | POA: Diagnosis not present

## 2016-09-23 DIAGNOSIS — M5416 Radiculopathy, lumbar region: Secondary | ICD-10-CM | POA: Diagnosis not present

## 2016-09-26 DIAGNOSIS — M545 Low back pain: Secondary | ICD-10-CM | POA: Diagnosis not present

## 2016-09-26 DIAGNOSIS — M5416 Radiculopathy, lumbar region: Secondary | ICD-10-CM | POA: Diagnosis not present

## 2016-09-26 DIAGNOSIS — S39013A Strain of muscle, fascia and tendon of pelvis, initial encounter: Secondary | ICD-10-CM | POA: Diagnosis not present

## 2016-09-26 DIAGNOSIS — M9903 Segmental and somatic dysfunction of lumbar region: Secondary | ICD-10-CM | POA: Diagnosis not present

## 2016-09-26 DIAGNOSIS — M9905 Segmental and somatic dysfunction of pelvic region: Secondary | ICD-10-CM | POA: Diagnosis not present

## 2016-09-29 DIAGNOSIS — M9903 Segmental and somatic dysfunction of lumbar region: Secondary | ICD-10-CM | POA: Diagnosis not present

## 2016-09-29 DIAGNOSIS — S39013A Strain of muscle, fascia and tendon of pelvis, initial encounter: Secondary | ICD-10-CM | POA: Diagnosis not present

## 2016-09-29 DIAGNOSIS — M9905 Segmental and somatic dysfunction of pelvic region: Secondary | ICD-10-CM | POA: Diagnosis not present

## 2016-09-29 DIAGNOSIS — M5416 Radiculopathy, lumbar region: Secondary | ICD-10-CM | POA: Diagnosis not present

## 2016-10-04 DIAGNOSIS — M9905 Segmental and somatic dysfunction of pelvic region: Secondary | ICD-10-CM | POA: Diagnosis not present

## 2016-10-04 DIAGNOSIS — M9903 Segmental and somatic dysfunction of lumbar region: Secondary | ICD-10-CM | POA: Diagnosis not present

## 2016-10-04 DIAGNOSIS — M5416 Radiculopathy, lumbar region: Secondary | ICD-10-CM | POA: Diagnosis not present

## 2016-10-04 DIAGNOSIS — S39013A Strain of muscle, fascia and tendon of pelvis, initial encounter: Secondary | ICD-10-CM | POA: Diagnosis not present

## 2016-10-10 DIAGNOSIS — M5416 Radiculopathy, lumbar region: Secondary | ICD-10-CM | POA: Diagnosis not present

## 2016-10-10 DIAGNOSIS — S39013A Strain of muscle, fascia and tendon of pelvis, initial encounter: Secondary | ICD-10-CM | POA: Diagnosis not present

## 2016-10-10 DIAGNOSIS — M9905 Segmental and somatic dysfunction of pelvic region: Secondary | ICD-10-CM | POA: Diagnosis not present

## 2016-10-10 DIAGNOSIS — M9903 Segmental and somatic dysfunction of lumbar region: Secondary | ICD-10-CM | POA: Diagnosis not present

## 2016-10-31 DIAGNOSIS — M5416 Radiculopathy, lumbar region: Secondary | ICD-10-CM | POA: Diagnosis not present

## 2016-10-31 DIAGNOSIS — M9903 Segmental and somatic dysfunction of lumbar region: Secondary | ICD-10-CM | POA: Diagnosis not present

## 2016-10-31 DIAGNOSIS — S39013A Strain of muscle, fascia and tendon of pelvis, initial encounter: Secondary | ICD-10-CM | POA: Diagnosis not present

## 2016-10-31 DIAGNOSIS — M9905 Segmental and somatic dysfunction of pelvic region: Secondary | ICD-10-CM | POA: Diagnosis not present

## 2016-11-28 DIAGNOSIS — M5033 Other cervical disc degeneration, cervicothoracic region: Secondary | ICD-10-CM | POA: Diagnosis not present

## 2016-11-28 DIAGNOSIS — M62830 Muscle spasm of back: Secondary | ICD-10-CM | POA: Diagnosis not present

## 2016-11-28 DIAGNOSIS — M9901 Segmental and somatic dysfunction of cervical region: Secondary | ICD-10-CM | POA: Diagnosis not present

## 2016-11-28 DIAGNOSIS — M9903 Segmental and somatic dysfunction of lumbar region: Secondary | ICD-10-CM | POA: Diagnosis not present

## 2016-12-28 DIAGNOSIS — M9903 Segmental and somatic dysfunction of lumbar region: Secondary | ICD-10-CM | POA: Diagnosis not present

## 2016-12-28 DIAGNOSIS — M9901 Segmental and somatic dysfunction of cervical region: Secondary | ICD-10-CM | POA: Diagnosis not present

## 2016-12-28 DIAGNOSIS — M62830 Muscle spasm of back: Secondary | ICD-10-CM | POA: Diagnosis not present

## 2016-12-28 DIAGNOSIS — M5033 Other cervical disc degeneration, cervicothoracic region: Secondary | ICD-10-CM | POA: Diagnosis not present

## 2017-01-17 DIAGNOSIS — Z87891 Personal history of nicotine dependence: Secondary | ICD-10-CM | POA: Diagnosis not present

## 2017-01-17 DIAGNOSIS — R0789 Other chest pain: Secondary | ICD-10-CM | POA: Diagnosis not present

## 2017-01-17 DIAGNOSIS — R05 Cough: Secondary | ICD-10-CM | POA: Diagnosis not present

## 2017-01-17 DIAGNOSIS — J069 Acute upper respiratory infection, unspecified: Secondary | ICD-10-CM | POA: Diagnosis not present

## 2017-01-23 DIAGNOSIS — M9903 Segmental and somatic dysfunction of lumbar region: Secondary | ICD-10-CM | POA: Diagnosis not present

## 2017-01-23 DIAGNOSIS — M5033 Other cervical disc degeneration, cervicothoracic region: Secondary | ICD-10-CM | POA: Diagnosis not present

## 2017-01-23 DIAGNOSIS — M9901 Segmental and somatic dysfunction of cervical region: Secondary | ICD-10-CM | POA: Diagnosis not present

## 2017-01-23 DIAGNOSIS — M62830 Muscle spasm of back: Secondary | ICD-10-CM | POA: Diagnosis not present

## 2017-02-20 DIAGNOSIS — M5033 Other cervical disc degeneration, cervicothoracic region: Secondary | ICD-10-CM | POA: Diagnosis not present

## 2017-02-20 DIAGNOSIS — M9901 Segmental and somatic dysfunction of cervical region: Secondary | ICD-10-CM | POA: Diagnosis not present

## 2017-02-20 DIAGNOSIS — M9903 Segmental and somatic dysfunction of lumbar region: Secondary | ICD-10-CM | POA: Diagnosis not present

## 2017-02-20 DIAGNOSIS — M62830 Muscle spasm of back: Secondary | ICD-10-CM | POA: Diagnosis not present

## 2017-03-02 DIAGNOSIS — M159 Polyosteoarthritis, unspecified: Secondary | ICD-10-CM | POA: Diagnosis not present

## 2017-03-02 DIAGNOSIS — R7989 Other specified abnormal findings of blood chemistry: Secondary | ICD-10-CM | POA: Diagnosis not present

## 2017-03-02 DIAGNOSIS — Z0000 Encounter for general adult medical examination without abnormal findings: Secondary | ICD-10-CM | POA: Diagnosis not present

## 2017-03-02 DIAGNOSIS — M419 Scoliosis, unspecified: Secondary | ICD-10-CM | POA: Diagnosis not present

## 2017-03-09 DIAGNOSIS — Z0000 Encounter for general adult medical examination without abnormal findings: Secondary | ICD-10-CM | POA: Diagnosis not present

## 2017-03-09 DIAGNOSIS — R1319 Other dysphagia: Secondary | ICD-10-CM | POA: Diagnosis not present

## 2017-03-09 DIAGNOSIS — Z1231 Encounter for screening mammogram for malignant neoplasm of breast: Secondary | ICD-10-CM | POA: Diagnosis not present

## 2017-03-14 ENCOUNTER — Other Ambulatory Visit: Payer: Self-pay | Admitting: Internal Medicine

## 2017-03-14 DIAGNOSIS — Z1231 Encounter for screening mammogram for malignant neoplasm of breast: Secondary | ICD-10-CM

## 2017-03-20 DIAGNOSIS — M62830 Muscle spasm of back: Secondary | ICD-10-CM | POA: Diagnosis not present

## 2017-03-20 DIAGNOSIS — M5033 Other cervical disc degeneration, cervicothoracic region: Secondary | ICD-10-CM | POA: Diagnosis not present

## 2017-03-20 DIAGNOSIS — M9901 Segmental and somatic dysfunction of cervical region: Secondary | ICD-10-CM | POA: Diagnosis not present

## 2017-03-20 DIAGNOSIS — M9903 Segmental and somatic dysfunction of lumbar region: Secondary | ICD-10-CM | POA: Diagnosis not present

## 2017-04-24 DIAGNOSIS — M9903 Segmental and somatic dysfunction of lumbar region: Secondary | ICD-10-CM | POA: Diagnosis not present

## 2017-04-24 DIAGNOSIS — M9901 Segmental and somatic dysfunction of cervical region: Secondary | ICD-10-CM | POA: Diagnosis not present

## 2017-04-24 DIAGNOSIS — M62830 Muscle spasm of back: Secondary | ICD-10-CM | POA: Diagnosis not present

## 2017-04-24 DIAGNOSIS — M5033 Other cervical disc degeneration, cervicothoracic region: Secondary | ICD-10-CM | POA: Diagnosis not present

## 2017-06-14 ENCOUNTER — Ambulatory Visit
Admission: RE | Admit: 2017-06-14 | Discharge: 2017-06-14 | Disposition: A | Discharge status: Home / Self Care | Payer: Medicare HMO | Source: Ambulatory Visit | Attending: Internal Medicine | Admitting: Internal Medicine

## 2017-06-14 DIAGNOSIS — Z1231 Encounter for screening mammogram for malignant neoplasm of breast: Secondary | ICD-10-CM | POA: Diagnosis not present

## 2017-11-06 ENCOUNTER — Other Ambulatory Visit: Payer: Self-pay

## 2017-11-06 ENCOUNTER — Emergency Department: Payer: Medicare HMO

## 2017-11-06 DIAGNOSIS — S52502A Unspecified fracture of the lower end of left radius, initial encounter for closed fracture: Secondary | ICD-10-CM | POA: Insufficient documentation

## 2017-11-06 DIAGNOSIS — Y9301 Activity, walking, marching and hiking: Secondary | ICD-10-CM | POA: Diagnosis not present

## 2017-11-06 DIAGNOSIS — Y929 Unspecified place or not applicable: Secondary | ICD-10-CM | POA: Insufficient documentation

## 2017-11-06 DIAGNOSIS — S6992XA Unspecified injury of left wrist, hand and finger(s), initial encounter: Secondary | ICD-10-CM | POA: Diagnosis present

## 2017-11-06 DIAGNOSIS — Y998 Other external cause status: Secondary | ICD-10-CM | POA: Diagnosis not present

## 2017-11-06 DIAGNOSIS — Z87891 Personal history of nicotine dependence: Secondary | ICD-10-CM | POA: Insufficient documentation

## 2017-11-06 DIAGNOSIS — Z79899 Other long term (current) drug therapy: Secondary | ICD-10-CM | POA: Insufficient documentation

## 2017-11-06 DIAGNOSIS — W010XXA Fall on same level from slipping, tripping and stumbling without subsequent striking against object, initial encounter: Secondary | ICD-10-CM | POA: Diagnosis not present

## 2017-11-06 NOTE — ED Triage Notes (Signed)
Pt arrives to ED via POV from home s/p slip and fall resulting in an injury to the left wrist. Pt reports she was walking to the bathroom when a rug slipped out from under her and she injured her wrist by attempting to break her fall. No head or neck pain/injury; no LOC. Pt has a wrist splint applied upon arrival, no obvious deformity or dislocation noted; CMS intact in affected extremity.

## 2017-11-07 ENCOUNTER — Emergency Department
Admission: EM | Admit: 2017-11-07 | Discharge: 2017-11-07 | Disposition: A | Discharge status: Home / Self Care | Payer: Medicare HMO | Attending: Emergency Medicine | Admitting: Emergency Medicine

## 2017-11-07 DIAGNOSIS — S52502A Unspecified fracture of the lower end of left radius, initial encounter for closed fracture: Secondary | ICD-10-CM

## 2017-11-07 MED ORDER — OXYCODONE-ACETAMINOPHEN 5-325 MG PO TABS
1.0000 | ORAL_TABLET | Freq: Once | ORAL | Status: AC
  Administered 2017-11-07: 1 via ORAL
  Filled 2017-11-07: qty 1

## 2017-11-07 MED ORDER — OXYCODONE-ACETAMINOPHEN 5-325 MG PO TABS
1.0000 | ORAL_TABLET | Freq: Once | ORAL | Status: AC
  Administered 2017-11-07: 1 via ORAL

## 2017-11-07 MED ORDER — OXYCODONE-ACETAMINOPHEN 5-325 MG PO TABS: 1.0000 | ORAL_TABLET | ORAL | 0 refills | Status: AC | PRN

## 2017-11-07 NOTE — ED Provider Notes (Signed)
[facility] Emergency Department Provider Note ______________  Time seen: 12:54 AM  I have reviewed the triage vital signs and the nursing notes.   HISTORY  Chief Complaint Fall and Wrist Pain    HPI Yvonne White is a 72 y.o. female with below list of chronic medical conditions presents to the emergency department with history of accidental slip and fall with resultant left wrist pain and swelling.  Patient states that she slipped on a rug while going up stairs resulted in a fall.  Patient denies any head injury no loss of consciousness.  Patient admits to 7 out of 10 left wrist pain worse with any movement.   Past Medical History:  Diagnosis Date  . Abnormal mammogram, unspecified   . Arthritis 2005  . Diffuse cystic mastopathy   . Diverticulosis of colon (without mention of hemorrhage)   . Personal history of colonic polyps 2000   large polyp  . Personal history of tobacco use, presenting hazards to health 2013  . Special screening for malignant neoplasms, colon 2013   family history of colon polyp and cancer    Patient Active Problem List   Diagnosis Date Noted  . History of colonic polyps 06/30/2015  . Fibrocystic disease of both breasts 05/29/2013  . Personal history of tobacco use, presenting hazards to health   . Personal history of colonic polyps   . Special screening for malignant neoplasms, colon     Past Surgical History:  Procedure Laterality Date  . ABDOMINAL HYSTERECTOMY  1991  . COLONOSCOPY  [id number]   Dr. Bonna Gains  . COLONOSCOPY WITH PROPOFOL N/A 07/06/2016   Procedure: COLONOSCOPY WITH PROPOFOL;  Surgeon: Christene Lye, MD;  Location: [facility] ENDOSCOPY;  Service: Endoscopy;  Laterality: N/A;  . FLEXIBLE SIGMOIDOSCOPY  2001  . FOOT SURGERY  2009,2010, [id number]  . HAND SURGERY Left 2012    Prior to Admission medications   Medication Sig Start Date End Date Taking? Authorizing Provider  Cholecalciferol  (VITAMIN D3) 3000 UNITS TABS Take by mouth as directed.    [provider]  Coenzyme Q10 (CO Q 10 PO) Take by mouth.    [provider]  Cyanocobalamin (VITAMIN B 12 PO) Take 1 tablet by mouth as directed.     [provider]  diclofenac (VOLTAREN) 75 MG EC tablet Take 1 tablet by mouth daily. 05/01/13   [provider]  misoprostol (CYTOTEC) 200 MCG tablet Take 1 tablet by mouth daily. 05/01/13   [provider]  polyethylene glycol powder (GLYCOLAX/MIRALAX) powder 255 grams one bottle for colonoscopy prep 06/15/16   Christene Lye, MD    Allergies No known drug allergies  Family History  Problem Relation Age of Onset  . Colon polyps Mother        pre cancerous  . Cancer Maternal Aunt        breast cancer  . Breast cancer Maternal Aunt 67  . Colon polyps Sister     Social History Social History   Tobacco Use  . Smoking status: Former Smoker    Years: 20.00    Types: Cigarettes  . Smokeless tobacco: Never Used  . Tobacco comment: quit 2005  Substance Use Topics  . Alcohol use: Yes    Alcohol/week: 1.2 oz    Types: 2 Standard drinks or equivalent per week  . Drug use: No    Review of Systems Constitutional: No fever/chills Eyes: No visual changes. ENT: No sore throat. Cardiovascular: Denies chest  pain. Respiratory: Denies shortness of breath. Gastrointestinal: No abdominal pain.  No nausea, no vomiting.  No diarrhea.  No constipation. Genitourinary: Negative for dysuria. Musculoskeletal: Negative for neck pain.  Negative for back pain.  Positive for left wrist pain Integumentary: Negative for rash. Neurological: Negative for headaches, focal weakness or numbness.  ____________________________________________   PHYSICAL EXAM:  VITAL SIGNS: ED Triage Vitals  Enc Vitals Group     BP 11/06/17 2243 109/67     Pulse Rate 11/06/17 2243 76     Resp 11/06/17 2243 16     Temp 11/06/17 2243 98 F (36.7 C)     Temp  Source 11/06/17 2243 Oral     SpO2 11/06/17 2243 98 %     Weight 11/06/17 2242 62.6 kg (138 lb)     Height 11/06/17 2242 1.626 m (5\' 4" )     Head Circumference --      Peak Flow --      Pain Score 11/06/17 2242 7     Pain Loc --      Pain Edu? --      Excl. in [place]? --     Constitutional: Alert and oriented. Well appearing and in no acute distress. Eyes: Conjunctivae are normal.  Head: Atraumatic. Mouth/Throat: Mucous membranes are moist.  Oropharynx non-erythematous. Neck: No stridor.  Cardiovascular: Normal rate, regular rhythm. Good peripheral circulation. Grossly normal heart sounds. Respiratory: Normal respiratory effort.  No retractions. Lungs CTAB.  Musculoskeletal: Positive for left wrist swelling ecchymoses pain with active and passive range of motion.  Palpable radial pulse.  Sensation intact.. Neurologic:  Normal speech and language. No gross focal neurologic deficits are appreciated.  Skin:  Skin is warm, dry and intact. No rash noted.   ____________________________________________   _______________  RADIOLOGY Zella Ball, personally viewed and evaluated these images (plain radiographs) as part of my medical decision making, as well as reviewing the written report by the radiologist.  ED MD interpretation:  Acute comminuted and impacted distal radial fracture with intra-articular extension.  Official radiology report(s): Dg Wrist Complete Left  Result Date: 11/06/2017 CLINICAL DATA:  72 y/o  F; fall with left wrist injury. EXAM: LEFT WRIST - COMPLETE 3+ VIEW COMPARISON:  None. FINDINGS: Acute comminuted and impacted distal radius fracture with intra-articular extension. Slight volar apex angulation of the fracture. Normal radiocarpal articulation. Well corticated bony body adjacent to the ulnar styloid, likely intra-articular body from prior trauma. Absent trapezium, likely postsurgical. IMPRESSION: Acute comminuted and impacted distal radius fracture with  intra-articular extension and slight volar apex angulation. Electronically Signed   By: Kristine Garbe M.D.   On: 11/06/2017 23:21     Procedures   ____________________________________________   INITIAL IMPRESSION / ASSESSMENT AND PLAN / ED COURSE  As part of my medical decision making, I reviewed the following data within the electronic MEDICAL RECORD NUMBER   72 year old female presented with above-stated history and physical exam secondary to accidental slip and fall with resultant left distal radius fracture.  Question possibility of a ulnar styloid fracture as well however bone is well-corticated and as such consider this to potentially be chronic.  Thumb spica splint was applied to the patient after she was given Percocet.  Patient will be referred to Dr. Amedeo Plenty hand surgeon who has operated on the patient in the past.  ____________________________________________  FINAL CLINICAL IMPRESSION(S) / ED DIAGNOSES  Final diagnoses:  Closed fracture of distal end of left radius, unspecified fracture morphology, initial encounter  MEDICATIONS GIVEN DURING THIS VISIT:  Medications  oxyCODONE-acetaminophen (PERCOCET/ROXICET) 5-325 MG per tablet 1 tablet (has no administration in time range)     ED Discharge Orders    None       Note:  This document was prepared using Dragon voice recognition software and may include unintentional dictation errors.    Gregor Hams, MD 11/07/17 [phone]

## 2017-11-10 ENCOUNTER — Other Ambulatory Visit: Payer: Self-pay

## 2017-11-10 ENCOUNTER — Encounter (HOSPITAL_COMMUNITY): Payer: Self-pay | Admitting: *Deleted

## 2017-11-10 NOTE — Progress Notes (Signed)
Spoke with pt for pre-op call. Pt denies cardiac history, HTN or Diabetes. 

## 2017-11-11 ENCOUNTER — Encounter (HOSPITAL_COMMUNITY): Payer: Self-pay | Admitting: *Deleted

## 2017-11-11 ENCOUNTER — Encounter (HOSPITAL_COMMUNITY)
Admission: RE | Disposition: A | Discharge status: Home / Self Care | Payer: Self-pay | Source: Ambulatory Visit | Attending: Orthopedic Surgery

## 2017-11-11 ENCOUNTER — Ambulatory Visit (HOSPITAL_COMMUNITY)
Admission: RE | Admit: 2017-11-11 | Discharge: 2017-11-11 | Disposition: A | Discharge status: Home / Self Care | Payer: Medicare HMO | Source: Ambulatory Visit | Attending: Orthopedic Surgery | Admitting: Orthopedic Surgery

## 2017-11-11 ENCOUNTER — Ambulatory Visit (HOSPITAL_COMMUNITY): Payer: Medicare HMO | Admitting: Certified Registered"

## 2017-11-11 DIAGNOSIS — Z86010 Personal history of colonic polyps: Secondary | ICD-10-CM | POA: Insufficient documentation

## 2017-11-11 DIAGNOSIS — M65832 Other synovitis and tenosynovitis, left forearm: Secondary | ICD-10-CM | POA: Diagnosis not present

## 2017-11-11 DIAGNOSIS — Z79891 Long term (current) use of opiate analgesic: Secondary | ICD-10-CM | POA: Diagnosis not present

## 2017-11-11 DIAGNOSIS — Z79899 Other long term (current) drug therapy: Secondary | ICD-10-CM | POA: Diagnosis not present

## 2017-11-11 DIAGNOSIS — Z791 Long term (current) use of non-steroidal anti-inflammatories (NSAID): Secondary | ICD-10-CM | POA: Diagnosis not present

## 2017-11-11 DIAGNOSIS — S52572A Other intraarticular fracture of lower end of left radius, initial encounter for closed fracture: Secondary | ICD-10-CM | POA: Insufficient documentation

## 2017-11-11 DIAGNOSIS — Z87891 Personal history of nicotine dependence: Secondary | ICD-10-CM | POA: Insufficient documentation

## 2017-11-11 DIAGNOSIS — K219 Gastro-esophageal reflux disease without esophagitis: Secondary | ICD-10-CM | POA: Insufficient documentation

## 2017-11-11 DIAGNOSIS — M1991 Primary osteoarthritis, unspecified site: Secondary | ICD-10-CM | POA: Insufficient documentation

## 2017-11-11 DIAGNOSIS — X58XXXA Exposure to other specified factors, initial encounter: Secondary | ICD-10-CM | POA: Insufficient documentation

## 2017-11-11 DIAGNOSIS — Z8371 Family history of colonic polyps: Secondary | ICD-10-CM | POA: Diagnosis not present

## 2017-11-11 HISTORY — DX: Gastro-esophageal reflux disease without esophagitis: K21.9

## 2017-11-11 HISTORY — PX: ORIF WRIST FRACTURE: SHX2133

## 2017-11-11 SURGERY — OPEN REDUCTION INTERNAL FIXATION (ORIF) WRIST FRACTURE
Anesthesia: Monitor Anesthesia Care | Site: Wrist | Laterality: Left

## 2017-11-11 MED ORDER — ONDANSETRON HCL 4 MG/2ML IJ SOLN
INTRAMUSCULAR | Status: DC | PRN
  Administered 2017-11-11: 4 mg via INTRAVENOUS

## 2017-11-11 MED ORDER — CHLORHEXIDINE GLUCONATE 4 % EX LIQD: 60.0000 mL | Freq: Once | CUTANEOUS | Status: DC

## 2017-11-11 MED ORDER — CEFAZOLIN SODIUM-DEXTROSE 2-4 GM/100ML-% IV SOLN
2.0000 g | INTRAVENOUS | Status: AC
  Administered 2017-11-11: 2 g via INTRAVENOUS
  Filled 2017-11-11: qty 100

## 2017-11-11 MED ORDER — DEXAMETHASONE SODIUM PHOSPHATE 10 MG/ML IJ SOLN
INTRAMUSCULAR | Status: DC | PRN
  Administered 2017-11-11: 10 mg via INTRAVENOUS

## 2017-11-11 MED ORDER — PHENYLEPHRINE 40 MCG/ML (10ML) SYRINGE FOR IV PUSH (FOR BLOOD PRESSURE SUPPORT)
PREFILLED_SYRINGE | INTRAVENOUS | Status: DC | PRN
  Administered 2017-11-11 (×2): 120 ug via INTRAVENOUS
  Administered 2017-11-11 (×2): 80 ug via INTRAVENOUS

## 2017-11-11 MED ORDER — MIDAZOLAM HCL 2 MG/2ML IJ SOLN
INTRAMUSCULAR | Status: AC
  Filled 2017-11-11: qty 2

## 2017-11-11 MED ORDER — PROPOFOL 10 MG/ML IV BOLUS
INTRAVENOUS | Status: DC | PRN
  Administered 2017-11-11: 20 mg via INTRAVENOUS
  Administered 2017-11-11: 30 mg via INTRAVENOUS

## 2017-11-11 MED ORDER — ROPIVACAINE HCL 7.5 MG/ML IJ SOLN
INTRAMUSCULAR | Status: DC | PRN
  Administered 2017-11-11: 20 mL via PERINEURAL

## 2017-11-11 MED ORDER — BUPIVACAINE-EPINEPHRINE (PF) 0.5% -1:200000 IJ SOLN
INTRAMUSCULAR | Status: DC | PRN
  Administered 2017-11-11: 10 mL via PERINEURAL

## 2017-11-11 MED ORDER — FENTANYL CITRATE (PF) 100 MCG/2ML IJ SOLN: 25.0000 ug | INTRAMUSCULAR | Status: DC | PRN

## 2017-11-11 MED ORDER — LACTATED RINGERS IV SOLN
INTRAVENOUS | Status: DC | PRN
  Administered 2017-11-11 (×2): via INTRAVENOUS

## 2017-11-11 MED ORDER — PROPOFOL 500 MG/50ML IV EMUL
INTRAVENOUS | Status: DC | PRN
  Administered 2017-11-11: 200 ug/kg/min via INTRAVENOUS

## 2017-11-11 MED ORDER — FENTANYL CITRATE (PF) 250 MCG/5ML IJ SOLN
INTRAMUSCULAR | Status: DC | PRN
  Administered 2017-11-11: 50 ug via INTRAVENOUS

## 2017-11-11 MED ORDER — MIDAZOLAM HCL 2 MG/2ML IJ SOLN
INTRAMUSCULAR | Status: DC | PRN
  Administered 2017-11-11: 1 mg via INTRAVENOUS

## 2017-11-11 MED ORDER — LACTATED RINGERS IV SOLN
INTRAVENOUS | Status: DC
  Administered 2017-11-11: 08:00:00 via INTRAVENOUS

## 2017-11-11 MED ORDER — PROPOFOL 10 MG/ML IV BOLUS
INTRAVENOUS | Status: AC
  Filled 2017-11-11: qty 20

## 2017-11-11 MED ORDER — FENTANYL CITRATE (PF) 250 MCG/5ML IJ SOLN
INTRAMUSCULAR | Status: AC
Start: 2017-11-11 — End: ?
  Filled 2017-11-11: qty 5

## 2017-11-11 MED ORDER — 0.9 % SODIUM CHLORIDE (POUR BTL) OPTIME
TOPICAL | Status: DC | PRN
  Administered 2017-11-11: 1000 mL

## 2017-11-11 SURGICAL SUPPLY — 65 items
BANDAGE ACE 3X5.8 VEL STRL LF (GAUZE/BANDAGES/DRESSINGS) ×2 IMPLANT
BANDAGE ACE 4X5 VEL STRL LF (GAUZE/BANDAGES/DRESSINGS) ×2 IMPLANT
BANDAGE ELASTIC 4 VELCRO ST LF (GAUZE/BANDAGES/DRESSINGS) ×2 IMPLANT
BIT DRILL 2.2 SS TIBIAL (BIT) ×2 IMPLANT
BLADE CLIPPER SURG (BLADE) IMPLANT
BNDG ESMARK 4X9 LF (GAUZE/BANDAGES/DRESSINGS) ×2 IMPLANT
BNDG GAUZE ELAST 4 BULKY (GAUZE/BANDAGES/DRESSINGS) ×2 IMPLANT
BUCKET CAST 5QT PAPER WAX WHT (CAST SUPPLIES) ×2 IMPLANT
CANISTER SUCT 3000ML PPV (MISCELLANEOUS) ×2 IMPLANT
CAST PADDING SYN 3 (CAST SUPPLIES) ×2 IMPLANT
CORDS BIPOLAR (ELECTRODE) ×2 IMPLANT
COVER SURGICAL LIGHT HANDLE (MISCELLANEOUS) ×2 IMPLANT
CUFF TOURNIQUET SINGLE 18IN (TOURNIQUET CUFF) ×2 IMPLANT
CUFF TOURNIQUET SINGLE 24IN (TOURNIQUET CUFF) IMPLANT
DRAIN TLS ROUND 10FR (DRAIN) IMPLANT
DRAPE OEC MINIVIEW 54X84 (DRAPES) IMPLANT
DRAPE SURG 17X23 STRL (DRAPES) ×2 IMPLANT
DRSG MEPITEL 4X7.2 (GAUZE/BANDAGES/DRESSINGS) ×2 IMPLANT
GAUZE SPONGE 4X4 12PLY STRL (GAUZE/BANDAGES/DRESSINGS) ×2 IMPLANT
GAUZE SPONGE 4X4 16PLY XRAY LF (GAUZE/BANDAGES/DRESSINGS) ×2 IMPLANT
GAUZE XEROFORM 1X8 LF (GAUZE/BANDAGES/DRESSINGS) ×2 IMPLANT
GAUZE XEROFORM 5X9 LF (GAUZE/BANDAGES/DRESSINGS) ×2 IMPLANT
GLOVE BIOGEL M 8.0 STRL (GLOVE) ×2 IMPLANT
GLOVE SS BIOGEL STRL SZ 8 (GLOVE) ×1 IMPLANT
GLOVE SUPERSENSE BIOGEL SZ 8 (GLOVE) ×1
GOWN STRL REUS W/ TWL LRG LVL3 (GOWN DISPOSABLE) ×1 IMPLANT
GOWN STRL REUS W/ TWL XL LVL3 (GOWN DISPOSABLE) ×2 IMPLANT
GOWN STRL REUS W/TWL LRG LVL3 (GOWN DISPOSABLE) ×1
GOWN STRL REUS W/TWL XL LVL3 (GOWN DISPOSABLE) ×2
KIT BASIN OR (CUSTOM PROCEDURE TRAY) ×2 IMPLANT
KIT TURNOVER KIT B (KITS) ×2 IMPLANT
LOOP VESSEL MAXI BLUE (MISCELLANEOUS) IMPLANT
MANIFOLD NEPTUNE II (INSTRUMENTS) ×2 IMPLANT
NEEDLE 22X1 1/2 (OR ONLY) (NEEDLE) IMPLANT
NS IRRIG 1000ML POUR BTL (IV SOLUTION) ×2 IMPLANT
PACK ORTHO EXTREMITY (CUSTOM PROCEDURE TRAY) ×2 IMPLANT
PAD ARMBOARD 7.5X6 YLW CONV (MISCELLANEOUS) ×4 IMPLANT
PAD CAST 3X4 CTTN HI CHSV (CAST SUPPLIES) ×1 IMPLANT
PAD CAST 4YDX4 CTTN HI CHSV (CAST SUPPLIES) ×1 IMPLANT
PADDING CAST COTTON 3X4 STRL (CAST SUPPLIES) ×1
PADDING CAST COTTON 4X4 STRL (CAST SUPPLIES) ×1
PADDING CAST SYNTHETIC 4 (CAST SUPPLIES) ×1
PADDING CAST SYNTHETIC 4X4 STR (CAST SUPPLIES) ×1 IMPLANT
PEG LOCKING SMOOTH 2.2X20 (Screw) ×12 IMPLANT
PILLOW ARM CARTER ADULT (MISCELLANEOUS) ×2 IMPLANT
PLATE NARROW DVR LEFT (Plate) ×2 IMPLANT
SCREW LOCK 14X2.7X 3 LD TPR (Screw) ×3 IMPLANT
SCREW LOCKING 2.7X14 (Screw) ×3 IMPLANT
SCREW LOCKING 2.7X15MM (Screw) ×2 IMPLANT
SCRUB BETADINE 4OZ XXX (MISCELLANEOUS) ×2 IMPLANT
SOL PREP POV-IOD 4OZ 10% (MISCELLANEOUS) ×2 IMPLANT
SPONGE LAP 4X18 RFD (DISPOSABLE) IMPLANT
SUT MNCRL AB 4-0 PS2 18 (SUTURE) ×2 IMPLANT
SUT PROLENE 3 0 PS 2 (SUTURE) IMPLANT
SUT PROLENE 4 0 PS 2 18 (SUTURE) ×4 IMPLANT
SUT VIC AB 3-0 FS2 27 (SUTURE) IMPLANT
SUT VIC AB 3-0 SH 27 (SUTURE) ×1
SUT VIC AB 3-0 SH 27X BRD (SUTURE) ×1 IMPLANT
SYR CONTROL 10ML LL (SYRINGE) IMPLANT
SYSTEM CHEST DRAIN TLS 7FR (DRAIN) ×2 IMPLANT
TOWEL OR 17X24 6PK STRL BLUE (TOWEL DISPOSABLE) ×2 IMPLANT
TOWEL OR 17X26 10 PK STRL BLUE (TOWEL DISPOSABLE) ×2 IMPLANT
TUBE CONNECTING 12X1/4 (SUCTIONS) ×2 IMPLANT
TUBE EVACUATION TLS (MISCELLANEOUS) ×2 IMPLANT
WATER STERILE IRR 1000ML POUR (IV SOLUTION) ×2 IMPLANT

## 2017-11-11 NOTE — Anesthesia Postprocedure Evaluation (Signed)
Anesthesia Post Note  Patient: Yvonne White  Procedure(s) Performed: OPEN REDUCTION INTERNAL FIXATION (ORIF) WRIST FRACTURE AND REPAIR (Left Wrist)     Patient location during evaluation: PACU Anesthesia Type: MAC and Regional Level of consciousness: awake and alert Pain management: pain level controlled Vital Signs Assessment: post-procedure vital signs reviewed and stable Respiratory status: spontaneous breathing, nonlabored ventilation, respiratory function stable and patient connected to nasal cannula oxygen Cardiovascular status: stable and blood pressure returned to baseline Postop Assessment: no apparent nausea or vomiting Anesthetic complications: no    Last Vitals:  Vitals:   11/11/17 1102 11/11/17 1114  BP: 118/79 120/76  Pulse: 63 67  Resp: 18 (!) 21  Temp:    SpO2: 97% 96%    Last Pain:  Vitals:   11/11/17 1032  TempSrc:   PainSc: 0-No pain                 Roxanne Orner

## 2017-11-11 NOTE — Transfer of Care (Signed)
Immediate Anesthesia Transfer of Care Note  Patient: Yvonne White  Procedure(s) Performed: OPEN REDUCTION INTERNAL FIXATION (ORIF) WRIST FRACTURE AND REPAIR (Left Wrist)  Patient Location: PACU  Anesthesia Type:MAC combined with regional for post-op pain  Level of Consciousness: awake and alert   Airway & Oxygen Therapy: Patient Spontanous Breathing  Post-op Assessment: Report given to RN and Post -op Vital signs reviewed and stable  Post vital signs: Reviewed and stable  Last Vitals:  Vitals Value Taken Time  BP    Temp    Pulse    Resp    SpO2      Last Pain:  Vitals:   11/11/17 0656  TempSrc: Oral      Patients Stated Pain Goal: 4 (36/14/43 1540)  Complications: No apparent anesthesia complications

## 2017-11-11 NOTE — Anesthesia Preprocedure Evaluation (Signed)
Anesthesia Evaluation  Patient identified by MRN, date of birth, ID band Patient awake    Reviewed: Allergy & Precautions, NPO status , Patient's Chart, lab work & pertinent test results  History of Anesthesia Complications Negative for: history of anesthetic complications  Airway Mallampati: I  TM Distance: >3 FB Neck ROM: Full    Dental  (+) Teeth Intact   Pulmonary neg shortness of breath, neg sleep apnea, neg COPD, neg recent URI, former smoker,    breath sounds clear to auscultation       Cardiovascular negative cardio ROS   Rhythm:Regular     Neuro/Psych negative neurological ROS  negative psych ROS   GI/Hepatic Neg liver ROS, GERD  Controlled,  Endo/Other  negative endocrine ROS  Renal/GU negative Renal ROS     Musculoskeletal  (+) Arthritis , Left wrist fx   Abdominal   Peds  Hematology negative hematology ROS (+)   Anesthesia Other Findings   Reproductive/Obstetrics                             Anesthesia Physical Anesthesia Plan  ASA: I  Anesthesia Plan: MAC and Regional   Post-op Pain Management:    Induction:   PONV Risk Score and Plan: 2 and Treatment may vary due to age or medical condition and Ondansetron  Airway Management Planned: Nasal Cannula  Additional Equipment:   Intra-op Plan:   Post-operative Plan:   Informed Consent: I have reviewed the patients History and Physical, chart, labs and discussed the procedure including the risks, benefits and alternatives for the proposed anesthesia with the patient or authorized representative who has indicated his/her understanding and acceptance.   Dental advisory given  Plan Discussed with: Surgeon  Anesthesia Plan Comments:         Anesthesia Quick Evaluation

## 2017-11-11 NOTE — Op Note (Signed)
NAME: SHERRIN, STAHLE MEDICAL RECORD OX:[id number] ACCOUNT [id number] DATE OF BIRTH:1945/08/11 FACILITY: [facility] LOCATION: [facility] PHYSICIAN:Zeyad Delaguila M. Amenda Duclos, MD  OPERATIVE REPORT  DATE OF PROCEDURE:  11/11/2017  PREOPERATIVE DIAGNOSIS:  Left comminuted complex distal radius fracture, greater than five-part intraarticular.  POSTOPERATIVE DIAGNOSIS:  Left comminuted complex distal radius fracture, greater than five-part intraarticular.  PROCEDURE:  Open reduction and internal fixation with DVR Crosslock plate, left wrist fracture.  SURGEON:  Roseanne Kaufman, MD  ASSISTANT:  None.  COMPLICATIONS:  None.  INTRAOPERATIVE 1.  Four-view radiographic series. 2.  FCR tenolysis (flexor carpi radialis tenolysis).  ESTIMATED BLOOD LOSS:  Minimal.  COMPLICATIONS:  None.  DRAINS:  1  INDICATIONS:  A 72 year old female who presents with above-mentioned diagnosis.  She has preexisting arthritis and a history of thumb CMC arthroplasty performed by myself.  She has a displaced comminuted fracture.  In an effort to try to give her the  best wrist and hand possible, we are planning for operative stabilization.  OPERATIVE PROCEDURE:  The patient was seen by myself and anesthesia and taken to the operative theater and underwent smooth induction of IV sedation.  Preoperative block was placed in an excellent working fashion.  Preoperative antibiotics were given.   She had no known allergies.  The operation commenced with Hibiclens scrub x2 followed by Betadine scrub and paint, followed by sterile field being secured.  Timeout was secured once again, and an incision was made volar radially.  Dissection was carried  down.  FCR tendon was adhesed, likely owing to the prior use of the FCR with [facility] arthroplasty.  With this noted, I performed a FCR tenolysis tenosynovectomy.  This was above and beyond the usual and customary approach.  Following the tenolysis, we then  performed very careful and cautious  look to the carpal canal contents and swept these ulnarly.  I accessed the pronator, incised this, and then recreated the fracture.  Orthopedic reduction technique was used, and a narrow DVR plate and screw construct  was used to afford fixation to the fracture.  The patient underwent ORIF, and the DVR Crosslock plate and screw construct was able to achieve adequate height, inclination, and volar tilt.  I was pleased with this in the findings.  There were no complicating features.  Following this, we did review the x-rays and saved final copies.  We irrigated with a liter of saline, closed the pronator with Vicryl and the skin edge over a TLS size 7 drain with Prolene.  Soft compartments, excellent refill.  No complicating features.  The fingers have a tremendous amount of arthritis, but do not have an obvious fracture.  The patient will be discharged home.  We will see her back in the office in 12-14 days and notify same if problems occur.  These notes have been discussed.  All questions addressed.  It was a pleasure to see and treat her today, and we look forward to  participating in postop recovery.  Once again, this was an ORIF distal radius fracture with associated FCR tenolysis, tenosynovectomy, and 4-view radiographic series performed, examined, and interpreted by myself.  LN/NUANCE  D:11/11/2017 T:11/11/2017 JOB:[id number]

## 2017-11-11 NOTE — Anesthesia Procedure Notes (Signed)
Anesthesia Regional Block: Axillary brachial plexus block   Pre-Anesthetic Checklist: ,, timeout performed, Correct Patient, Correct Site, Correct Laterality, Correct Procedure, Correct Position, site marked, Risks and benefits discussed,  Surgical consent,  Pre-op evaluation,  At surgeon's request and post-op pain management  Laterality: Upper and Left  Prep: chloraprep       Needles:  Injection technique: Single-shot  Needle Type: Echogenic Needle          Additional Needles:   Procedures:,,,, ultrasound used (permanent image in chart),,,,  Narrative:  Start time: 11/11/2017 8:44 AM End time: 11/11/2017 8:49 AM Injection made incrementally with aspirations every 5 mL.  Performed by: Personally   Additional Notes: H+P and labs reviewed, risks and benefits discussed with patient, procedure tolerated well without complications

## 2017-11-11 NOTE — Progress Notes (Signed)
Orthopedic Tech Progress Note Patient Details:  Yvonne White 28-Jan-1946 [id number]  Ortho Devices Type of Ortho Device: (carter arm pillow) Ortho Device/Splint Interventions: Ordered  as ordered by Dr. Freddi Starr, Desani Sprung 11/11/2017, 7:26 AM

## 2017-11-11 NOTE — Progress Notes (Signed)
Per Dr. Ermalene Postin no Hgb needed.

## 2017-11-11 NOTE — H&P (Signed)
Yvonne White is an 72 y.o. female.   Chief Complaint: Left comminuted distal radius fracture HPI: Patient presents for open reduction internal fixation left distal radius fracture comminuted complex intra-articular  Patient presents for evaluation and treatment of the of their upper extremity predicament. The patient denies neck, back, chest or  abdominal pain. The patient notes that they have no lower extremity problems. The patients primary complaint is noted. We are planning surgical care pathway for the upper extremity.  Past Medical History:  Diagnosis Date  . Abnormal mammogram, unspecified   . Arthritis 2005  . Diffuse cystic mastopathy   . Diverticulosis of colon (without mention of hemorrhage)   . GERD (gastroesophageal reflux disease)   . Personal history of colonic polyps 2000   large polyp  . Personal history of tobacco use, presenting hazards to health 2013  . Special screening for malignant neoplasms, colon 2013   family history of colon polyp and cancer    Past Surgical History:  Procedure Laterality Date  . ABDOMINAL HYSTERECTOMY  1991  . COLONOSCOPY  [id number]   Yvonne White  . COLONOSCOPY WITH PROPOFOL N/A 07/06/2016   Procedure: COLONOSCOPY WITH PROPOFOL;  Surgeon: Yvonne Lye, MD;  Location: [facility];  Service: Endoscopy;  Laterality: N/A;  . FLEXIBLE SIGMOIDOSCOPY  2001  . FOOT SURGERY  2009,2010, [id number]  . HAND SURGERY Left 2012  . TONSILLECTOMY      Family History  Problem Relation Age of Onset  . Colon polyps Mother        pre cancerous  . Hiatal hernia Mother   . Cancer Maternal Aunt        breast cancer  . Breast cancer Maternal Aunt 76  . Colon polyps Sister   . Diverticulitis Father    Social History:  reports that she has quit smoking. Her smoking use included cigarettes. She quit after 20.00 years of use. She has never used smokeless tobacco. She reports that she drinks about 5.4 oz of alcohol per week. She  reports that she does not use drugs.  Allergies: No Known Allergies  Medications Prior to Admission  Medication Sig Dispense Refill  . Calcium Carb-Cholecalciferol (CALCIUM-VITAMIN D3) 600-400 MG-UNIT TABS Take 1 tablet by mouth daily.    . Cholecalciferol (VITAMIN D3) 3000 UNITS TABS Take 3,000 Units by mouth 2 (two) times a week.     . Coenzyme Q10 (CO Q 10 PO) Take 1 capsule by mouth daily.     . Cyanocobalamin (VITAMIN B 12 PO) Take 1 tablet by mouth 2 (two) times a week.     . diclofenac (VOLTAREN) 75 MG EC tablet Take 75 mg by mouth 2 (two) times daily.     . misoprostol (CYTOTEC) 200 MCG tablet Take 200 mcg by mouth 2 (two) times daily.     Marland Kitchen oxyCODONE-acetaminophen (PERCOCET) 5-325 MG tablet Take 1 tablet by mouth every 4 (four) hours as needed for severe pain. 20 tablet 0  . Probiotic Product (PROBIOTIC PO) Take 1 capsule by mouth daily.    . Red Yeast Rice 600 MG CAPS Take 600 mg by mouth daily.    . TURMERIC PO Take 1 capsule by mouth daily.    . polyethylene glycol powder (GLYCOLAX/MIRALAX) powder 255 grams one bottle for colonoscopy prep (Patient not taking: Reported on 11/09/2017) 255 g 0    No results found for this or any previous visit (from the past 75 hour(s)). No results found.  Review of Systems  Cardiovascular: Negative.  Gastrointestinal: Negative.   Genitourinary: Negative.     Blood pressure 140/78, pulse 74, temperature 97.7 F (36.5 C), temperature source Oral, resp. rate 20, SpO2 98 %. Physical Exam  Left distal radius fracture comminuted complex intra-articular with displacement.  Patient has large amount of bruising and swelling no signs of infection or compartment syndrome  The patient is alert and oriented in no acute distress. The patient complains of pain in the affected upper extremity.  The patient is noted to have a normal HEENT exam. Lung fields show equal chest expansion and no shortness of breath. Abdomen exam is nontender without  distention. Lower extremity examination does not show any fracture dislocation or blood clot symptoms. Pelvis is stable and the neck and back are stable and nontender. Assessment/Plan We will plan for open reduction internal fixation left distal radius fracture with repair is necessary  We are planning surgery for your upper extremity. The risk and benefits of surgery to include risk of bleeding, infection, anesthesia,  damage to normal structures and failure of the surgery to accomplish its intended goals of relieving symptoms and restoring function have been discussed in detail. With this in mind we plan to proceed. I have specifically discussed with the patient the pre-and postoperative regime and the dos and don'ts and risk and benefits in great detail. Risk and benefits of surgery also include risk of dystrophy(CRPS), chronic nerve pain, failure of the healing process to go onto completion and other inherent risks of surgery The relavent the pathophysiology of the disease/injury process, as well as the alternatives for treatment and postoperative course of action has been discussed in great detail with the patient who desires to proceed.  We will do everything in our power to help you (the patient) restore function to the upper extremity. It is a pleasure to see this patient today.   Yvonne Frater III, MD 11/11/2017, 8:54 AM

## 2017-11-11 NOTE — Discharge Instructions (Signed)
We recommend that you to take vitamin C 1000 mg a day to promote healing. We also recommend that if you require  pain medicine that you take a stool softener to prevent constipation as most pain medicines will have constipation side effects. We recommend either Peri-Colace or Senokot and recommend that you also consider adding MiraLAX as well to prevent the constipation affects from pain medicine if you are required to use them. These medicines are over the counter and may be purchased at a local pharmacy. A cup of yogurt and a probiotic can also be helpful during the recovery process as the medicines can disrupt your intestinal environment. Keep bandage clean and dry.  Call for any problems.  No smoking.  Criteria for driving a car: you should be off your pain medicine for 7-8 hours, able to drive one handed(confident), thinking clearly and feeling able in your judgement to drive. Continue elevation as it will decrease swelling.  If instructed by MD move your fingers within the confines of the bandage/splint.  Use ice if instructed by your MD. Call immediately for any sudden loss of feeling in your hand/arm or change in functional abilities of the extremity.    Change your drain every 4 hours and discard in the am

## 2017-11-11 NOTE — Op Note (Signed)
See OECX#[id number] SP ORIF radius Fx left  Demari Gales MD

## 2017-11-11 NOTE — Anesthesia Procedure Notes (Signed)
Procedure Name: MAC Date/Time: 11/11/2017 9:16 AM Performed by: Imagene Riches, CRNA Pre-anesthesia Checklist: Patient identified, Emergency Drugs available, Suction available and Patient being monitored Patient Re-evaluated:Patient Re-evaluated prior to induction Oxygen Delivery Method: Simple face mask Preoxygenation: Pre-oxygenation with 100% oxygen Induction Type: IV induction

## 2017-11-13 ENCOUNTER — Encounter (HOSPITAL_COMMUNITY): Payer: Self-pay | Admitting: Orthopedic Surgery

## 2017-11-14 ENCOUNTER — Other Ambulatory Visit: Payer: Self-pay

## 2018-05-02 ENCOUNTER — Other Ambulatory Visit: Payer: Self-pay | Admitting: Internal Medicine

## 2018-05-02 DIAGNOSIS — Z1231 Encounter for screening mammogram for malignant neoplasm of breast: Secondary | ICD-10-CM

## 2018-06-21 ENCOUNTER — Ambulatory Visit
Admission: RE | Admit: 2018-06-21 | Discharge: 2018-06-21 | Disposition: A | Discharge status: Home / Self Care | Payer: Medicare HMO | Source: Ambulatory Visit | Attending: Internal Medicine | Admitting: Internal Medicine

## 2018-06-21 DIAGNOSIS — Z1231 Encounter for screening mammogram for malignant neoplasm of breast: Secondary | ICD-10-CM

## 2019-01-07 ENCOUNTER — Other Ambulatory Visit: Payer: Self-pay | Admitting: Physical Medicine and Rehabilitation

## 2019-01-07 DIAGNOSIS — M5416 Radiculopathy, lumbar region: Secondary | ICD-10-CM

## 2019-01-09 ENCOUNTER — Ambulatory Visit
Admission: RE | Admit: 2019-01-09 | Discharge: 2019-01-09 | Disposition: A | Discharge status: Home / Self Care | Payer: Medicare HMO | Source: Ambulatory Visit | Attending: Physical Medicine and Rehabilitation | Admitting: Physical Medicine and Rehabilitation

## 2019-01-09 ENCOUNTER — Other Ambulatory Visit: Payer: Self-pay

## 2019-01-09 DIAGNOSIS — M5416 Radiculopathy, lumbar region: Secondary | ICD-10-CM

## 2019-02-19 ENCOUNTER — Other Ambulatory Visit: Payer: Self-pay | Admitting: Gastroenterology

## 2019-02-19 DIAGNOSIS — K7689 Other specified diseases of liver: Secondary | ICD-10-CM

## 2019-02-19 DIAGNOSIS — N281 Cyst of kidney, acquired: Secondary | ICD-10-CM

## 2019-02-25 ENCOUNTER — Ambulatory Visit
Admission: RE | Admit: 2019-02-25 | Discharge: 2019-02-25 | Disposition: A | Discharge status: Home / Self Care | Payer: Medicare HMO | Source: Ambulatory Visit | Attending: Gastroenterology | Admitting: Gastroenterology

## 2019-02-25 ENCOUNTER — Other Ambulatory Visit: Payer: Self-pay

## 2019-02-25 DIAGNOSIS — K7689 Other specified diseases of liver: Secondary | ICD-10-CM | POA: Diagnosis present

## 2019-02-25 DIAGNOSIS — N281 Cyst of kidney, acquired: Secondary | ICD-10-CM

## 2019-02-26 ENCOUNTER — Other Ambulatory Visit: Payer: Self-pay | Admitting: Gastroenterology

## 2019-02-26 DIAGNOSIS — N281 Cyst of kidney, acquired: Secondary | ICD-10-CM

## 2019-03-11 ENCOUNTER — Ambulatory Visit
Admission: RE | Admit: 2019-03-11 | Discharge: 2019-03-11 | Disposition: A | Discharge status: Home / Self Care | Payer: Medicare HMO | Source: Ambulatory Visit | Attending: Gastroenterology | Admitting: Gastroenterology

## 2019-03-11 ENCOUNTER — Other Ambulatory Visit: Payer: Self-pay

## 2019-03-11 DIAGNOSIS — N281 Cyst of kidney, acquired: Secondary | ICD-10-CM | POA: Insufficient documentation

## 2019-03-11 LAB — POCT I-STAT CREATININE: Creatinine, Ser: 0.9 mg/dL (ref 0.44–1.00)

## 2019-03-11 MED ORDER — GADOBUTROL 1 MMOL/ML IV SOLN
6.0000 mL | Freq: Once | INTRAVENOUS | Status: AC | PRN
  Administered 2019-03-11: 6 mL via INTRAVENOUS

## 2019-04-09 ENCOUNTER — Other Ambulatory Visit: Payer: Self-pay | Admitting: Internal Medicine

## 2019-04-09 DIAGNOSIS — Z1231 Encounter for screening mammogram for malignant neoplasm of breast: Secondary | ICD-10-CM

## 2019-06-26 ENCOUNTER — Ambulatory Visit
Admission: RE | Admit: 2019-06-26 | Discharge: 2019-06-26 | Disposition: A | Discharge status: Home / Self Care | Payer: Medicare HMO | Source: Ambulatory Visit | Attending: Internal Medicine | Admitting: Internal Medicine

## 2019-06-26 DIAGNOSIS — Z1231 Encounter for screening mammogram for malignant neoplasm of breast: Secondary | ICD-10-CM | POA: Diagnosis not present

## 2019-07-02 ENCOUNTER — Other Ambulatory Visit: Payer: Self-pay | Admitting: Internal Medicine

## 2019-07-02 DIAGNOSIS — N6489 Other specified disorders of breast: Secondary | ICD-10-CM

## 2019-07-02 DIAGNOSIS — R928 Other abnormal and inconclusive findings on diagnostic imaging of breast: Secondary | ICD-10-CM

## 2019-07-09 ENCOUNTER — Ambulatory Visit
Admission: RE | Admit: 2019-07-09 | Discharge: 2019-07-09 | Disposition: A | Discharge status: Home / Self Care | Payer: Medicare HMO | Source: Ambulatory Visit | Attending: Internal Medicine | Admitting: Internal Medicine

## 2019-07-09 DIAGNOSIS — N6489 Other specified disorders of breast: Secondary | ICD-10-CM | POA: Diagnosis present

## 2019-07-09 DIAGNOSIS — R928 Other abnormal and inconclusive findings on diagnostic imaging of breast: Secondary | ICD-10-CM

## 2020-02-26 ENCOUNTER — Other Ambulatory Visit: Payer: Self-pay | Admitting: Infectious Diseases

## 2020-02-26 ENCOUNTER — Other Ambulatory Visit: Payer: Self-pay

## 2020-02-26 ENCOUNTER — Ambulatory Visit
Admission: RE | Admit: 2020-02-26 | Discharge: 2020-02-26 | Disposition: A | Discharge status: Home / Self Care | Payer: Medicare HMO | Source: Ambulatory Visit | Attending: Infectious Diseases | Admitting: Infectious Diseases

## 2020-02-26 ENCOUNTER — Other Ambulatory Visit
Admission: RE | Admit: 2020-02-26 | Discharge: 2020-02-26 | Disposition: A | Discharge status: Home / Self Care | Payer: Medicare HMO | Source: Ambulatory Visit | Attending: Infectious Diseases | Admitting: Infectious Diseases

## 2020-02-26 DIAGNOSIS — R0789 Other chest pain: Secondary | ICD-10-CM | POA: Insufficient documentation

## 2020-02-26 DIAGNOSIS — R109 Unspecified abdominal pain: Secondary | ICD-10-CM | POA: Insufficient documentation

## 2020-02-26 LAB — TROPONIN I (HIGH SENSITIVITY): Troponin I (High Sensitivity): 7 ng/L (ref <18)

## 2020-02-26 LAB — FIBRIN DERIVATIVES D-DIMER (ARMC ONLY): Fibrin derivatives D-dimer (ARMC): 652.16 ng/mL (FEU) — ABNORMAL HIGH (ref 0.00–499.00)

## 2020-04-23 LAB — EXTERNAL GENERIC LAB PROCEDURE: COLOGUARD: NEGATIVE

## 2020-04-28 DIAGNOSIS — M170 Bilateral primary osteoarthritis of knee: Secondary | ICD-10-CM | POA: Diagnosis not present

## 2020-05-05 DIAGNOSIS — M62830 Muscle spasm of back: Secondary | ICD-10-CM | POA: Diagnosis not present

## 2020-05-05 DIAGNOSIS — M810 Age-related osteoporosis without current pathological fracture: Secondary | ICD-10-CM | POA: Diagnosis not present

## 2020-05-05 DIAGNOSIS — M9901 Segmental and somatic dysfunction of cervical region: Secondary | ICD-10-CM | POA: Diagnosis not present

## 2020-05-05 DIAGNOSIS — M5033 Other cervical disc degeneration, cervicothoracic region: Secondary | ICD-10-CM | POA: Diagnosis not present

## 2020-05-05 DIAGNOSIS — M9903 Segmental and somatic dysfunction of lumbar region: Secondary | ICD-10-CM | POA: Diagnosis not present

## 2020-05-13 DIAGNOSIS — H2511 Age-related nuclear cataract, right eye: Secondary | ICD-10-CM | POA: Diagnosis not present

## 2020-05-18 DIAGNOSIS — Z8739 Personal history of other diseases of the musculoskeletal system and connective tissue: Secondary | ICD-10-CM | POA: Diagnosis not present

## 2020-05-18 DIAGNOSIS — K219 Gastro-esophageal reflux disease without esophagitis: Secondary | ICD-10-CM | POA: Diagnosis not present

## 2020-05-18 DIAGNOSIS — I700 Atherosclerosis of aorta: Secondary | ICD-10-CM | POA: Diagnosis not present

## 2020-05-18 DIAGNOSIS — F17200 Nicotine dependence, unspecified, uncomplicated: Secondary | ICD-10-CM | POA: Diagnosis not present

## 2020-05-18 DIAGNOSIS — I208 Other forms of angina pectoris: Secondary | ICD-10-CM | POA: Diagnosis not present

## 2020-05-18 DIAGNOSIS — Z01818 Encounter for other preprocedural examination: Secondary | ICD-10-CM | POA: Diagnosis not present

## 2020-05-18 DIAGNOSIS — R0602 Shortness of breath: Secondary | ICD-10-CM | POA: Diagnosis not present

## 2020-05-18 DIAGNOSIS — R011 Cardiac murmur, unspecified: Secondary | ICD-10-CM | POA: Diagnosis not present

## 2020-05-25 ENCOUNTER — Other Ambulatory Visit: Payer: Self-pay | Admitting: Internal Medicine

## 2020-05-25 DIAGNOSIS — Z1231 Encounter for screening mammogram for malignant neoplasm of breast: Secondary | ICD-10-CM

## 2020-06-02 DIAGNOSIS — M9903 Segmental and somatic dysfunction of lumbar region: Secondary | ICD-10-CM | POA: Diagnosis not present

## 2020-06-02 DIAGNOSIS — M62830 Muscle spasm of back: Secondary | ICD-10-CM | POA: Diagnosis not present

## 2020-06-02 DIAGNOSIS — M5033 Other cervical disc degeneration, cervicothoracic region: Secondary | ICD-10-CM | POA: Diagnosis not present

## 2020-06-02 DIAGNOSIS — M9901 Segmental and somatic dysfunction of cervical region: Secondary | ICD-10-CM | POA: Diagnosis not present

## 2020-06-19 DIAGNOSIS — I208 Other forms of angina pectoris: Secondary | ICD-10-CM | POA: Diagnosis not present

## 2020-06-19 DIAGNOSIS — R011 Cardiac murmur, unspecified: Secondary | ICD-10-CM | POA: Diagnosis not present

## 2020-06-19 DIAGNOSIS — I5189 Other ill-defined heart diseases: Secondary | ICD-10-CM

## 2020-06-19 DIAGNOSIS — R0602 Shortness of breath: Secondary | ICD-10-CM | POA: Diagnosis not present

## 2020-06-19 HISTORY — DX: Other ill-defined heart diseases: I51.89

## 2020-06-22 DIAGNOSIS — K219 Gastro-esophageal reflux disease without esophagitis: Secondary | ICD-10-CM | POA: Diagnosis not present

## 2020-06-22 DIAGNOSIS — I700 Atherosclerosis of aorta: Secondary | ICD-10-CM | POA: Diagnosis not present

## 2020-06-22 DIAGNOSIS — R0602 Shortness of breath: Secondary | ICD-10-CM | POA: Diagnosis not present

## 2020-06-22 DIAGNOSIS — Z01810 Encounter for preprocedural cardiovascular examination: Secondary | ICD-10-CM | POA: Diagnosis not present

## 2020-06-22 DIAGNOSIS — I208 Other forms of angina pectoris: Secondary | ICD-10-CM | POA: Diagnosis not present

## 2020-06-22 DIAGNOSIS — Z8739 Personal history of other diseases of the musculoskeletal system and connective tissue: Secondary | ICD-10-CM | POA: Diagnosis not present

## 2020-06-22 DIAGNOSIS — R010 Benign and innocent cardiac murmurs: Secondary | ICD-10-CM | POA: Diagnosis not present

## 2020-06-26 ENCOUNTER — Other Ambulatory Visit: Payer: Self-pay

## 2020-06-26 ENCOUNTER — Ambulatory Visit
Admission: RE | Admit: 2020-06-26 | Discharge: 2020-06-26 | Disposition: A | Discharge status: Home / Self Care | Payer: PPO | Source: Ambulatory Visit | Attending: Internal Medicine | Admitting: Internal Medicine

## 2020-06-26 DIAGNOSIS — Z1231 Encounter for screening mammogram for malignant neoplasm of breast: Secondary | ICD-10-CM | POA: Diagnosis not present

## 2020-06-29 DIAGNOSIS — M9903 Segmental and somatic dysfunction of lumbar region: Secondary | ICD-10-CM | POA: Diagnosis not present

## 2020-06-29 DIAGNOSIS — M5033 Other cervical disc degeneration, cervicothoracic region: Secondary | ICD-10-CM | POA: Diagnosis not present

## 2020-06-29 DIAGNOSIS — M62830 Muscle spasm of back: Secondary | ICD-10-CM | POA: Diagnosis not present

## 2020-06-29 DIAGNOSIS — M9901 Segmental and somatic dysfunction of cervical region: Secondary | ICD-10-CM | POA: Diagnosis not present

## 2020-06-30 DIAGNOSIS — M1711 Unilateral primary osteoarthritis, right knee: Secondary | ICD-10-CM | POA: Diagnosis not present

## 2020-06-30 DIAGNOSIS — M6281 Muscle weakness (generalized): Secondary | ICD-10-CM | POA: Diagnosis not present

## 2020-06-30 DIAGNOSIS — M25561 Pain in right knee: Secondary | ICD-10-CM | POA: Diagnosis not present

## 2020-06-30 DIAGNOSIS — Z01812 Encounter for preprocedural laboratory examination: Secondary | ICD-10-CM | POA: Diagnosis not present

## 2020-07-15 DIAGNOSIS — M1711 Unilateral primary osteoarthritis, right knee: Secondary | ICD-10-CM | POA: Diagnosis not present

## 2020-07-15 DIAGNOSIS — Z96651 Presence of right artificial knee joint: Secondary | ICD-10-CM | POA: Diagnosis not present

## 2020-07-15 DIAGNOSIS — G8918 Other acute postprocedural pain: Secondary | ICD-10-CM | POA: Diagnosis not present

## 2020-07-17 DIAGNOSIS — M6281 Muscle weakness (generalized): Secondary | ICD-10-CM | POA: Diagnosis not present

## 2020-07-17 DIAGNOSIS — M25561 Pain in right knee: Secondary | ICD-10-CM | POA: Diagnosis not present

## 2020-07-17 DIAGNOSIS — R262 Difficulty in walking, not elsewhere classified: Secondary | ICD-10-CM | POA: Diagnosis not present

## 2020-07-17 DIAGNOSIS — M25661 Stiffness of right knee, not elsewhere classified: Secondary | ICD-10-CM | POA: Diagnosis not present

## 2020-07-21 DIAGNOSIS — M6281 Muscle weakness (generalized): Secondary | ICD-10-CM | POA: Diagnosis not present

## 2020-07-21 DIAGNOSIS — R262 Difficulty in walking, not elsewhere classified: Secondary | ICD-10-CM | POA: Diagnosis not present

## 2020-07-21 DIAGNOSIS — M25561 Pain in right knee: Secondary | ICD-10-CM | POA: Diagnosis not present

## 2020-07-21 DIAGNOSIS — M25661 Stiffness of right knee, not elsewhere classified: Secondary | ICD-10-CM | POA: Diagnosis not present

## 2020-07-23 DIAGNOSIS — M25561 Pain in right knee: Secondary | ICD-10-CM | POA: Diagnosis not present

## 2020-07-28 DIAGNOSIS — M25661 Stiffness of right knee, not elsewhere classified: Secondary | ICD-10-CM | POA: Diagnosis not present

## 2020-07-28 DIAGNOSIS — M6281 Muscle weakness (generalized): Secondary | ICD-10-CM | POA: Diagnosis not present

## 2020-07-28 DIAGNOSIS — R262 Difficulty in walking, not elsewhere classified: Secondary | ICD-10-CM | POA: Diagnosis not present

## 2020-07-28 DIAGNOSIS — M25561 Pain in right knee: Secondary | ICD-10-CM | POA: Diagnosis not present

## 2020-07-30 DIAGNOSIS — M25561 Pain in right knee: Secondary | ICD-10-CM | POA: Diagnosis not present

## 2020-07-30 DIAGNOSIS — R262 Difficulty in walking, not elsewhere classified: Secondary | ICD-10-CM | POA: Diagnosis not present

## 2020-07-30 DIAGNOSIS — M25661 Stiffness of right knee, not elsewhere classified: Secondary | ICD-10-CM | POA: Diagnosis not present

## 2020-07-30 DIAGNOSIS — M6281 Muscle weakness (generalized): Secondary | ICD-10-CM | POA: Diagnosis not present

## 2020-08-04 DIAGNOSIS — M6281 Muscle weakness (generalized): Secondary | ICD-10-CM | POA: Diagnosis not present

## 2020-08-04 DIAGNOSIS — M25661 Stiffness of right knee, not elsewhere classified: Secondary | ICD-10-CM | POA: Diagnosis not present

## 2020-08-04 DIAGNOSIS — M25561 Pain in right knee: Secondary | ICD-10-CM | POA: Diagnosis not present

## 2020-08-04 DIAGNOSIS — R262 Difficulty in walking, not elsewhere classified: Secondary | ICD-10-CM | POA: Diagnosis not present

## 2020-08-17 DIAGNOSIS — M25561 Pain in right knee: Secondary | ICD-10-CM | POA: Diagnosis not present

## 2020-08-20 DIAGNOSIS — M25561 Pain in right knee: Secondary | ICD-10-CM | POA: Diagnosis not present

## 2020-09-01 DIAGNOSIS — M25561 Pain in right knee: Secondary | ICD-10-CM | POA: Diagnosis not present

## 2020-09-08 DIAGNOSIS — M25561 Pain in right knee: Secondary | ICD-10-CM | POA: Diagnosis not present

## 2020-09-15 DIAGNOSIS — M25561 Pain in right knee: Secondary | ICD-10-CM | POA: Diagnosis not present

## 2020-10-05 DIAGNOSIS — M9903 Segmental and somatic dysfunction of lumbar region: Secondary | ICD-10-CM | POA: Diagnosis not present

## 2020-10-05 DIAGNOSIS — M62830 Muscle spasm of back: Secondary | ICD-10-CM | POA: Diagnosis not present

## 2020-10-05 DIAGNOSIS — M9901 Segmental and somatic dysfunction of cervical region: Secondary | ICD-10-CM | POA: Diagnosis not present

## 2020-10-05 DIAGNOSIS — M5033 Other cervical disc degeneration, cervicothoracic region: Secondary | ICD-10-CM | POA: Diagnosis not present

## 2020-10-20 DIAGNOSIS — M9903 Segmental and somatic dysfunction of lumbar region: Secondary | ICD-10-CM | POA: Diagnosis not present

## 2020-10-20 DIAGNOSIS — M5033 Other cervical disc degeneration, cervicothoracic region: Secondary | ICD-10-CM | POA: Diagnosis not present

## 2020-10-20 DIAGNOSIS — M9901 Segmental and somatic dysfunction of cervical region: Secondary | ICD-10-CM | POA: Diagnosis not present

## 2020-10-20 DIAGNOSIS — M62830 Muscle spasm of back: Secondary | ICD-10-CM | POA: Diagnosis not present

## 2020-12-08 DIAGNOSIS — M9903 Segmental and somatic dysfunction of lumbar region: Secondary | ICD-10-CM | POA: Diagnosis not present

## 2020-12-08 DIAGNOSIS — M5033 Other cervical disc degeneration, cervicothoracic region: Secondary | ICD-10-CM | POA: Diagnosis not present

## 2020-12-08 DIAGNOSIS — M9901 Segmental and somatic dysfunction of cervical region: Secondary | ICD-10-CM | POA: Diagnosis not present

## 2020-12-08 DIAGNOSIS — M62830 Muscle spasm of back: Secondary | ICD-10-CM | POA: Diagnosis not present

## 2021-01-05 DIAGNOSIS — M9903 Segmental and somatic dysfunction of lumbar region: Secondary | ICD-10-CM | POA: Diagnosis not present

## 2021-01-05 DIAGNOSIS — M62830 Muscle spasm of back: Secondary | ICD-10-CM | POA: Diagnosis not present

## 2021-01-05 DIAGNOSIS — M9901 Segmental and somatic dysfunction of cervical region: Secondary | ICD-10-CM | POA: Diagnosis not present

## 2021-01-05 DIAGNOSIS — M5033 Other cervical disc degeneration, cervicothoracic region: Secondary | ICD-10-CM | POA: Diagnosis not present

## 2021-01-28 DIAGNOSIS — M1712 Unilateral primary osteoarthritis, left knee: Secondary | ICD-10-CM | POA: Diagnosis not present

## 2021-02-02 DIAGNOSIS — M9903 Segmental and somatic dysfunction of lumbar region: Secondary | ICD-10-CM | POA: Diagnosis not present

## 2021-02-02 DIAGNOSIS — M5033 Other cervical disc degeneration, cervicothoracic region: Secondary | ICD-10-CM | POA: Diagnosis not present

## 2021-02-02 DIAGNOSIS — M62830 Muscle spasm of back: Secondary | ICD-10-CM | POA: Diagnosis not present

## 2021-02-02 DIAGNOSIS — M9901 Segmental and somatic dysfunction of cervical region: Secondary | ICD-10-CM | POA: Diagnosis not present

## 2021-03-02 DIAGNOSIS — M62830 Muscle spasm of back: Secondary | ICD-10-CM | POA: Diagnosis not present

## 2021-03-02 DIAGNOSIS — M9901 Segmental and somatic dysfunction of cervical region: Secondary | ICD-10-CM | POA: Diagnosis not present

## 2021-03-02 DIAGNOSIS — M5033 Other cervical disc degeneration, cervicothoracic region: Secondary | ICD-10-CM | POA: Diagnosis not present

## 2021-03-02 DIAGNOSIS — M9903 Segmental and somatic dysfunction of lumbar region: Secondary | ICD-10-CM | POA: Diagnosis not present

## 2021-03-30 DIAGNOSIS — M9901 Segmental and somatic dysfunction of cervical region: Secondary | ICD-10-CM | POA: Diagnosis not present

## 2021-03-30 DIAGNOSIS — M5033 Other cervical disc degeneration, cervicothoracic region: Secondary | ICD-10-CM | POA: Diagnosis not present

## 2021-03-30 DIAGNOSIS — M62830 Muscle spasm of back: Secondary | ICD-10-CM | POA: Diagnosis not present

## 2021-03-30 DIAGNOSIS — M9903 Segmental and somatic dysfunction of lumbar region: Secondary | ICD-10-CM | POA: Diagnosis not present

## 2021-04-02 DIAGNOSIS — Z1211 Encounter for screening for malignant neoplasm of colon: Secondary | ICD-10-CM | POA: Diagnosis not present

## 2021-04-02 DIAGNOSIS — M8580 Other specified disorders of bone density and structure, unspecified site: Secondary | ICD-10-CM | POA: Diagnosis not present

## 2021-04-02 DIAGNOSIS — M419 Scoliosis, unspecified: Secondary | ICD-10-CM | POA: Diagnosis not present

## 2021-04-02 DIAGNOSIS — Z0000 Encounter for general adult medical examination without abnormal findings: Secondary | ICD-10-CM | POA: Diagnosis not present

## 2021-04-02 DIAGNOSIS — M159 Polyosteoarthritis, unspecified: Secondary | ICD-10-CM | POA: Diagnosis not present

## 2021-04-02 DIAGNOSIS — K449 Diaphragmatic hernia without obstruction or gangrene: Secondary | ICD-10-CM | POA: Diagnosis not present

## 2021-04-02 DIAGNOSIS — Z79899 Other long term (current) drug therapy: Secondary | ICD-10-CM | POA: Diagnosis not present

## 2021-04-02 DIAGNOSIS — R7989 Other specified abnormal findings of blood chemistry: Secondary | ICD-10-CM | POA: Diagnosis not present

## 2021-04-02 DIAGNOSIS — I700 Atherosclerosis of aorta: Secondary | ICD-10-CM | POA: Diagnosis not present

## 2021-04-02 DIAGNOSIS — Z780 Asymptomatic menopausal state: Secondary | ICD-10-CM | POA: Diagnosis not present

## 2021-04-09 DIAGNOSIS — Z0000 Encounter for general adult medical examination without abnormal findings: Secondary | ICD-10-CM | POA: Diagnosis not present

## 2021-04-09 DIAGNOSIS — Z1231 Encounter for screening mammogram for malignant neoplasm of breast: Secondary | ICD-10-CM | POA: Diagnosis not present

## 2021-04-09 DIAGNOSIS — I700 Atherosclerosis of aorta: Secondary | ICD-10-CM | POA: Diagnosis not present

## 2021-04-09 DIAGNOSIS — K449 Diaphragmatic hernia without obstruction or gangrene: Secondary | ICD-10-CM | POA: Diagnosis not present

## 2021-04-09 DIAGNOSIS — Z96651 Presence of right artificial knee joint: Secondary | ICD-10-CM | POA: Diagnosis not present

## 2021-04-09 DIAGNOSIS — L03116 Cellulitis of left lower limb: Secondary | ICD-10-CM | POA: Diagnosis not present

## 2021-04-12 ENCOUNTER — Other Ambulatory Visit: Payer: Self-pay | Admitting: Internal Medicine

## 2021-04-12 DIAGNOSIS — Z1231 Encounter for screening mammogram for malignant neoplasm of breast: Secondary | ICD-10-CM

## 2021-04-22 DIAGNOSIS — M419 Scoliosis, unspecified: Secondary | ICD-10-CM | POA: Diagnosis not present

## 2021-04-22 DIAGNOSIS — R030 Elevated blood-pressure reading, without diagnosis of hypertension: Secondary | ICD-10-CM | POA: Diagnosis not present

## 2021-04-27 DIAGNOSIS — M62830 Muscle spasm of back: Secondary | ICD-10-CM | POA: Diagnosis not present

## 2021-04-27 DIAGNOSIS — M9901 Segmental and somatic dysfunction of cervical region: Secondary | ICD-10-CM | POA: Diagnosis not present

## 2021-04-27 DIAGNOSIS — M9903 Segmental and somatic dysfunction of lumbar region: Secondary | ICD-10-CM | POA: Diagnosis not present

## 2021-04-27 DIAGNOSIS — M5033 Other cervical disc degeneration, cervicothoracic region: Secondary | ICD-10-CM | POA: Diagnosis not present

## 2021-05-03 DIAGNOSIS — M1712 Unilateral primary osteoarthritis, left knee: Secondary | ICD-10-CM | POA: Diagnosis not present

## 2021-05-03 NOTE — H&P (Signed)
KNEE ARTHROPLASTY ADMISSION H&P  Patient ID: Yvonne White MRN: [id number] DOB/AGE: 1946/03/19 76 y.o.  Chief Complaint: left knee pain.  Planned Procedure Date: 05/19/21 Medical and Cardiac Clearance by Dr. Ginette Pitman     HPI: Yvonne White is a 76 y.o. female who presents for evaluation of OA LEFT KNEE. The patient has a history of pain and functional disability in the left knee due to arthritis and has failed non-surgical conservative treatments for greater than 12 weeks to include NSAID's and/or analgesics, corticosteriod injections, viscosupplementation injections, use of assistive devices, and activity modification.  Onset of symptoms was gradual, starting 2 years ago with gradually worsening course since that time. The patient noted no past surgery on the left knee.  Patient currently rates pain at 4 out of 10 with activity. Patient has night pain, worsening of pain with activity and weight bearing, and pain that interferes with activities of daily living.  Patient has evidence of subchondral sclerosis, periarticular osteophytes, and joint space narrowing by imaging studies.  There is no active infection.  Past Medical History:  Diagnosis Date   Abnormal mammogram, unspecified    Arthritis 2005   Diverticulosis of colon (without mention of hemorrhage)    GERD (gastroesophageal reflux disease)    Personal history of colonic polyps 2000   large polyp   Personal history of tobacco use, presenting hazards to health 2013   Special screening for malignant neoplasms, colon 2013   family history of colon polyp and cancer   Past Surgical History:  Procedure Laterality Date   ABDOMINAL HYSTERECTOMY  1991   COLONOSCOPY  2000,2003,2008,2013   Dr. sankar-2013   COLONOSCOPY WITH PROPOFOL N/A 07/06/2016   Procedure: COLONOSCOPY WITH PROPOFOL;  Surgeon: Christene Lye, MD;  Location: [facility];  Service: Endoscopy;  Laterality: N/A;   FLEXIBLE SIGMOIDOSCOPY  2001   FOOT SURGERY   2009,2010, [phone]   HAND SURGERY Left 2012   ORIF WRIST FRACTURE Left 11/11/2017   Procedure: OPEN REDUCTION INTERNAL FIXATION (ORIF) WRIST FRACTURE AND REPAIR;  Surgeon: Roseanne Kaufman, MD;  Location: [place];  Service: Orthopedics;  Laterality: Left;  75 MINS   TONSILLECTOMY     No Known Allergies Prior to Admission medications   Medication Sig Start Date End Date Taking? Authorizing Provider  acetaminophen (TYLENOL) 500 MG tablet Take 1,000 mg by mouth daily.   Yes [provider]  Calcium Carb-Cholecalciferol (CALCIUM-VITAMIN D3) 600-400 MG-UNIT TABS Take 1 tablet by mouth in the morning and at bedtime. Chewables   Yes [provider]  diclofenac (VOLTAREN) 75 MG EC tablet Take 75 mg by mouth 2 (two) times daily.  05/01/13  Yes [provider]  Misc Natural Products (ELDERBERRY IMMUNE COMPLEX PO) Take 1 tablet by mouth daily.   Yes [provider]  Multiple Vitamins-Minerals (MULTIVITAMIN WITH MINERALS) tablet Take 1 tablet by mouth daily.   Yes [provider]  Multiple Vitamins-Minerals (PRESERVISION AREDS 2+MULTI VIT) CAPS Take 1 capsule by mouth in the morning and at bedtime.   Yes [provider]  pantoprazole (PROTONIX) 20 MG tablet Take 20 mg by mouth daily.   Yes [provider]  Vitamin D-Vitamin K (VITAMIN K2-VITAMIN D3 PO) Take 1 tablet by mouth daily.   Yes [provider]  Zinc 30 MG TABS Take 30 mg by mouth daily.   Yes [provider]   Social History   Socioeconomic History   Marital status: Married    Spouse name: Not on file  Number of children: Not on file   Years of education: Not on file   Highest education level: Not on file  Occupational History   Not on file  Tobacco Use   Smoking status: Former    Years: 20.00    Types: Cigarettes   Smokeless tobacco: Never   Tobacco comments:    quit 2005  Vaping Use   Vaping Use: Never used  Substance and Sexual Activity   Alcohol  use: Yes    Alcohol/week: 9.0 standard drinks    Types: 2 Standard drinks or equivalent, 7 Cans of beer per week    Comment: daily either beer or wine (1 beer or 1 wine)   Drug use: No   Sexual activity: Not on file  Other Topics Concern   Not on file  Social History Narrative   Not on file   Social Determinants of Health   Financial Resource Strain: Not on file  Food Insecurity: Not on file  Transportation Needs: Not on file  Physical Activity: Not on file  Stress: Not on file  Social Connections: Not on file   Family History  Problem Relation Age of Onset   Colon polyps Mother        pre cancerous   Hiatal hernia Mother    Cancer Maternal Aunt        breast cancer   Breast cancer Maternal Aunt 66   Colon polyps Sister    Diverticulitis Father     ROS: Currently denies lightheadedness, dizziness, Fever, chills, CP, SOB.   No personal history of DVT, PE, MI, or CVA. No loose teeth or dentures All other systems have been reviewed and were otherwise currently negative with the exception of those mentioned in the HPI and as above.  Objective: Vitals: Ht: 5'3" Wt: 130.2 lbs Temp: 98.4 BP: 137/77 Pulse: 99 O2 99% on room air.   Physical Exam: General: Alert, NAD.  Antalgic Gait  HEENT: EOMI, Good Neck Extension  Pulm: No increased work of breathing.  Clear B/L A/P w/o crackle or wheeze.  CV: RRR, No m/g/r appreciated  GI: soft, NT, ND. BS x 4 quadrants Neuro: CN II-XII grossly intact without focal deficit.  Sensation intact distally Skin: No lesions in the area of chief complaint. Does have some edema and erythema to the lateral aspect of the left lower leg superior to the lateral malleolus associated with a cat bite from 3 years ago. Area will occasionally become irritated and very itchy. Uses hydrocortisone cream or topical Betadine on area. No current open lesions or drainage.  MSK/Surgical Site: left knee w/o redness or effusion.  No JLT. ROM 0-120.  5/5 strength in  extension and flexion.  +EHL/FHL.  NVI.  Stable varus and valgus stress.    Imaging Review Plain radiographs demonstrate moderate degenerative joint disease of the left knee.   The overall alignment ismild varus. The bone quality appears to be fair for age and reported activity level.  Preoperative templating of the joint replacement has been completed, documented, and submitted to the Operating Room personnel in order to optimize intra-operative equipment management.  Assessment: OA LEFT KNEE Active Problems:   * No active hospital problems. *   Plan: Plan for Procedure(s): TOTAL KNEE ARTHROPLASTY  The patient history, physical exam, clinical judgement of the provider and imaging are consistent with end stage degenerative joint disease and total joint arthroplasty is deemed medically necessary. The treatment options including medical management, injection therapy, and arthroplasty were discussed at  length. The risks and benefits of Procedure(s): TOTAL KNEE ARTHROPLASTY were presented and reviewed.  The risks of nonoperative treatment, versus surgical intervention including but not limited to continued pain, aseptic loosening, stiffness, dislocation/subluxation, infection, bleeding, nerve injury, blood clots, cardiopulmonary complications, morbidity, mortality, among others were discussed. The patient verbalizes understanding and wishes to proceed with the plan.  Patient is being admitted for inpatient treatment for surgery, pain control, PT, prophylactic antibiotics, VTE prophylaxis, progressive ambulation, ADL's and discharge planning.   Dental prophylaxis discussed and recommended for 2 years postoperatively.  The patient does meet the criteria for TXA which will be used perioperatively.   ASA 81 mg BID will be used postoperatively for DVT prophylaxis in addition to SCDs, and early ambulation. Plan for Tylenol, Celebrex, Oxycodone for pain. She also specifically asked for Gabapentin  as this helped with her recovery from her previous knee replacement. Zofran for nausea and vomiting. Colace, Miralax, or Sennakot for constipation [facility]acy- [facility] on [address] in [place] The patient is planning to be discharged home with OPPT and into the care of her husband Randall Hiss who can be reached at [phone] Follow up appt 06/01/21 at 11:15am     Britt Bottom, PA-C Office [phone] 05/03/2021 6:42 PM

## 2021-05-04 DIAGNOSIS — G8929 Other chronic pain: Secondary | ICD-10-CM | POA: Diagnosis not present

## 2021-05-04 DIAGNOSIS — K219 Gastro-esophageal reflux disease without esophagitis: Secondary | ICD-10-CM | POA: Diagnosis not present

## 2021-05-04 DIAGNOSIS — Z87891 Personal history of nicotine dependence: Secondary | ICD-10-CM | POA: Diagnosis not present

## 2021-05-04 DIAGNOSIS — M810 Age-related osteoporosis without current pathological fracture: Secondary | ICD-10-CM | POA: Diagnosis not present

## 2021-05-04 DIAGNOSIS — M1990 Unspecified osteoarthritis, unspecified site: Secondary | ICD-10-CM | POA: Diagnosis not present

## 2021-05-07 NOTE — Pre-Procedure Instructions (Signed)
Surgical Instructions    Your procedure is scheduled on Wednesday 05/19/21.   Report to [facility] Main Entrance "A" at 10:00 A.M., then check in with the Admitting office.  Call this number if you have problems the morning of surgery:  [phone]   If you have any questions prior to your surgery date call [phone]: Open Monday-Friday 8am-4pm    Remember:  Do not eat after midnight the night before your surgery  You may drink clear liquids until 09:00 A.M. the morning of your surgery.   Clear liquids allowed are: Water, Non-Citrus Juices (without pulp), Carbonated Beverages, Clear Tea, Black Coffee Only, and Gatorade    Enhanced Recovery after Surgery for Orthopedics Enhanced Recovery after Surgery is a protocol used to improve the stress on your body and your recovery after surgery.  Patient Instructions  The day of surgery (if you do NOT have diabetes):  Drink ONE (1) Pre-Surgery Clear Ensure by 09:00 am the morning of surgery   This drink was given to you during your hospital  pre-op appointment visit. Nothing else to drink after completing the  Pre-Surgery Clear Ensure.         If you have questions, please contact your surgeons office.   Take these medicines the morning of surgery with A SIP OF WATER   acetaminophen (TYLENOL)   pantoprazole (PROTONIX)  As of today, STOP taking any Aspirin (unless otherwise instructed by your surgeon) diclofenac (VOLTAREN), Aleve, Naproxen, Ibuprofen, Motrin, Advil, Goody's, BC's, all herbal medications, fish oil, and all vitamins.                     Do NOT Smoke (Tobacco/Vaping) or drink Alcohol 24 hours prior to your procedure.  If you use a CPAP at night, you may bring all equipment for your overnight stay.   Contacts, glasses, piercing's, hearing aid's, dentures or partials may not be worn into surgery, please bring cases for these belongings.    For patients admitted to the hospital, discharge time will be determined by  your treatment team.   Patients discharged the day of surgery will not be allowed to drive home, and someone needs to stay with them for 24 hours.  NO VISITORS WILL BE ALLOWED IN PRE-OP WHERE PATIENTS GET READY FOR SURGERY.  ONLY 1 SUPPORT PERSON MAY BE PRESENT IN THE WAITING ROOM WHILE YOU ARE IN SURGERY.  IF YOU ARE TO BE ADMITTED, ONCE YOU ARE IN YOUR ROOM YOU WILL BE ALLOWED TWO (2) VISITORS.  Minor children may have two parents present. Special consideration for safety and communication needs will be reviewed on a case by case basis.   Special instructions:   - Preparing For Surgery  Before surgery, you can play an important role. Because skin is not sterile, your skin needs to be as free of germs as possible. You can reduce the number of germs on your skin by washing with CHG (chlorahexidine gluconate) Soap before surgery.  CHG is an antiseptic cleaner which kills germs and bonds with the skin to continue killing germs even after washing.    Oral Hygiene is also important to reduce your risk of infection.  Remember - BRUSH YOUR TEETH THE MORNING OF SURGERY WITH YOUR REGULAR TOOTHPASTE  Please do not use if you have an allergy to CHG or antibacterial soaps. If your skin becomes reddened/irritated stop using the CHG.  Do not shave (including legs and underarms) for at least 48 hours prior to first CHG  shower. It is OK to shave your face.  Please follow these instructions carefully.   Shower the NIGHT BEFORE SURGERY and the MORNING OF SURGERY  If you chose to wash your hair, wash your hair first as usual with your normal shampoo.  After you shampoo, rinse your hair and body thoroughly to remove the shampoo.  Use CHG Soap as you would any other liquid soap. You can apply CHG directly to the skin and wash gently with a scrungie or a clean washcloth.   Apply the CHG Soap to your body ONLY FROM THE NECK DOWN.  Do not use on open wounds or open sores. Avoid contact with your eyes,  ears, mouth and genitals (private parts). Wash Face and genitals (private parts)  with your normal soap.   Wash thoroughly, paying special attention to the area where your surgery will be performed.  Thoroughly rinse your body with warm water from the neck down.  DO NOT shower/wash with your normal soap after using and rinsing off the CHG Soap.  Pat yourself dry with a CLEAN TOWEL.  Wear CLEAN PAJAMAS to bed the night before surgery  Place CLEAN SHEETS on your bed the night before your surgery  DO NOT SLEEP WITH PETS.   Day of Surgery: Shower with CHG soap. Do not wear jewelry, make up, nail polish, gel polish, artificial nails, or any other type of covering on natural nails including finger and toenails. If patients have artificial nails, gel coating, etc. that need to be removed by a nail salon please have this removed prior to surgery. Surgery may need to be canceled/delayed if the surgeon/ anesthesia feels like the patient is unable to be adequately monitored. Do not wear lotions, powders, perfumes, or deodorant. Do not shave 48 hours prior to surgery.   Do not bring valuables to the hospital. [facility] is not responsible for any belongings or valuables. Wear Clean/Comfortable clothing the morning of surgery Remember to brush your teeth WITH YOUR REGULAR TOOTHPASTE.   Please read over the following fact sheets that you were given.   3 days prior to your procedure or After your COVID test   You are not required to quarantine however you are required to wear a well-fitting mask when you are out and around people not in your household. If your mask becomes wet or soiled, replace with a new one.   Wash your hands often with soap and water for 20 seconds or clean your hands with an alcohol-based hand sanitizer that contains at least 60% alcohol.   Do not share personal items.   Notify your provider:  o if you are in close contact with someone who has COVID  o or if you  develop a fever of 100.4 or greater, sneezing, cough, sore throat, shortness of breath or body aches.

## 2021-05-10 ENCOUNTER — Encounter (HOSPITAL_COMMUNITY)
Admission: RE | Admit: 2021-05-10 | Discharge: 2021-05-10 | Disposition: A | Discharge status: Home / Self Care | Payer: PPO | Source: Ambulatory Visit | Attending: Orthopedic Surgery | Admitting: Orthopedic Surgery

## 2021-05-10 ENCOUNTER — Encounter (HOSPITAL_COMMUNITY): Payer: Self-pay

## 2021-05-10 ENCOUNTER — Other Ambulatory Visit: Payer: Self-pay

## 2021-05-10 VITALS — BP 128/77 | HR 57 | Temp 97.5°F | Resp 17 | Ht 63.0 in | Wt 129.2 lb

## 2021-05-10 DIAGNOSIS — Z01812 Encounter for preprocedural laboratory examination: Secondary | ICD-10-CM | POA: Insufficient documentation

## 2021-05-10 DIAGNOSIS — E119 Type 2 diabetes mellitus without complications: Secondary | ICD-10-CM | POA: Diagnosis not present

## 2021-05-10 DIAGNOSIS — Z01818 Encounter for other preprocedural examination: Secondary | ICD-10-CM

## 2021-05-10 HISTORY — DX: Personal history of other diseases of the digestive system: Z87.19

## 2021-05-10 HISTORY — DX: Family history of other specified conditions: Z84.89

## 2021-05-10 LAB — SURGICAL PCR SCREEN
MRSA, PCR: NEGATIVE
Staphylococcus aureus: NEGATIVE

## 2021-05-10 LAB — CBC
HCT: 41.4 % (ref 36.0–46.0)
Hemoglobin: 13.1 g/dL (ref 12.0–15.0)
MCH: 30.5 pg (ref 26.0–34.0)
MCHC: 31.6 g/dL (ref 30.0–36.0)
MCV: 96.5 fL (ref 80.0–100.0)
Platelets: 290 10*3/uL (ref 150–400)
RBC: 4.29 MIL/uL (ref 3.87–5.11)
RDW: 13.1 % (ref 11.5–15.5)
WBC: 7.1 10*3/uL (ref 4.0–10.5)
nRBC: 0 % (ref 0.0–0.2)

## 2021-05-10 NOTE — Progress Notes (Signed)
PCP - Dr. Tracie Harrier Cardiologist - Dr. Lujean Amel  PPM/ICD - n/a  Chest x-ray - n/a EKG - n/a Stress Test - 06/19/20-CE ECHO - 06/19/20-CE Cardiac Cath - denies  Sleep Study - denies CPAP - denies  Blood Thinner Instructions: n/a Aspirin Instructions: n/a  ERAS Protcol -Clear liquids until 0900 DOS PRE-SURGERY Ensure or G2- (1) Ensure given  COVID TEST- Not indicated. Ambulatory surgery  Anesthesia review: No  Patient denies shortness of breath, fever, cough and chest pain at PAT appointment   All instructions explained to the patient, with a verbal understanding of the material. Patient agrees to go over the instructions while at home for a better understanding. Patient also instructed to self quarantine after being tested for COVID-19. The opportunity to ask questions was provided.

## 2021-05-11 NOTE — Care Plan (Signed)
Ortho Bundle Case Management Note  Patient Details  Name: Yvonne White MRN: [id number] Date of Birth: 03-31-1946    Met with patient in the office prior to surgery. She will discharge to home with family to assist. Has walker. CPM ordered. OPPT set up with [facility] in [place]. Patient and MD in agreement with plan. Choice offered                  DME Arranged:  [facility] DME Agency:  [employer]  HH Arranged:    [place] Agency:     Additional Comments: Please contact me with any questions of if this plan should need to change.  Ladell Heads,  [facility]  [phone] 05/11/2021, 3:08 PM

## 2021-05-12 DIAGNOSIS — M62830 Muscle spasm of back: Secondary | ICD-10-CM | POA: Diagnosis not present

## 2021-05-12 DIAGNOSIS — M9903 Segmental and somatic dysfunction of lumbar region: Secondary | ICD-10-CM | POA: Diagnosis not present

## 2021-05-12 DIAGNOSIS — M9901 Segmental and somatic dysfunction of cervical region: Secondary | ICD-10-CM | POA: Diagnosis not present

## 2021-05-12 DIAGNOSIS — M5033 Other cervical disc degeneration, cervicothoracic region: Secondary | ICD-10-CM | POA: Diagnosis not present

## 2021-05-14 DIAGNOSIS — M25572 Pain in left ankle and joints of left foot: Secondary | ICD-10-CM | POA: Diagnosis not present

## 2021-05-18 ENCOUNTER — Encounter (HOSPITAL_COMMUNITY): Payer: Self-pay | Admitting: Orthopedic Surgery

## 2021-05-18 ENCOUNTER — Other Ambulatory Visit: Payer: Self-pay

## 2021-05-18 NOTE — Anesthesia Preprocedure Evaluation (Addendum)
Anesthesia Evaluation  Patient identified by MRN, date of birth, ID band Patient awake    Reviewed: Allergy & Precautions, NPO status , Patient's Chart, lab work & pertinent test results  History of Anesthesia Complications Negative for: history of anesthetic complications  Airway Mallampati: I  TM Distance: >3 FB Neck ROM: Full    Dental no notable dental hx. (+) Teeth Intact, Dental Advisory Given   Pulmonary former smoker,    Pulmonary exam normal breath sounds clear to auscultation       Cardiovascular negative cardio ROS Normal cardiovascular exam Rhythm:Regular Rate:Normal     Neuro/Psych negative neurological ROS  negative psych ROS   GI/Hepatic Neg liver ROS, hiatal hernia, GERD  Controlled,  Endo/Other  negative endocrine ROS  Renal/GU negative Renal ROS  negative genitourinary   Musculoskeletal  (+) Arthritis , Osteoarthritis,    Abdominal   Peds  Hematology negative hematology ROS (+) hct 41.4, plt 290   Anesthesia Other Findings   Reproductive/Obstetrics negative OB ROS                            Anesthesia Physical Anesthesia Plan  ASA: 2  Anesthesia Plan: Spinal, Regional and MAC   Post-op Pain Management: Tylenol PO (pre-op), Toradol IV (intra-op) and Regional block   Induction:   PONV Risk Score and Plan: 2 and Propofol infusion and TIVA  Airway Management Planned: Natural Airway and Nasal Cannula  Additional Equipment: None  Intra-op Plan:   Post-operative Plan:   Informed Consent: I have reviewed the patients History and Physical, chart, labs and discussed the procedure including the risks, benefits and alternatives for the proposed anesthesia with the patient or authorized representative who has indicated his/her understanding and acceptance.     Dental advisory given  Plan Discussed with: CRNA  Anesthesia Plan Comments:        Anesthesia  Quick Evaluation

## 2021-05-19 ENCOUNTER — Ambulatory Visit (HOSPITAL_COMMUNITY): Payer: PPO | Admitting: Anesthesiology

## 2021-05-19 ENCOUNTER — Encounter (HOSPITAL_COMMUNITY)
Admission: RE | Disposition: A | Discharge status: Home / Self Care | Payer: Self-pay | Source: Home / Self Care | Attending: Orthopedic Surgery

## 2021-05-19 ENCOUNTER — Other Ambulatory Visit: Payer: Self-pay

## 2021-05-19 ENCOUNTER — Ambulatory Visit (HOSPITAL_COMMUNITY)
Admission: RE | Admit: 2021-05-19 | Discharge: 2021-05-19 | Disposition: A | Discharge status: Home / Self Care | Payer: PPO | Attending: Orthopedic Surgery | Admitting: Orthopedic Surgery

## 2021-05-19 ENCOUNTER — Encounter (HOSPITAL_COMMUNITY): Payer: Self-pay | Admitting: Orthopedic Surgery

## 2021-05-19 ENCOUNTER — Ambulatory Visit (HOSPITAL_COMMUNITY): Payer: PPO

## 2021-05-19 DIAGNOSIS — G8918 Other acute postprocedural pain: Secondary | ICD-10-CM | POA: Diagnosis not present

## 2021-05-19 DIAGNOSIS — M170 Bilateral primary osteoarthritis of knee: Secondary | ICD-10-CM | POA: Insufficient documentation

## 2021-05-19 DIAGNOSIS — Z96642 Presence of left artificial hip joint: Secondary | ICD-10-CM | POA: Diagnosis not present

## 2021-05-19 DIAGNOSIS — Z87891 Personal history of nicotine dependence: Secondary | ICD-10-CM | POA: Diagnosis not present

## 2021-05-19 DIAGNOSIS — M1712 Unilateral primary osteoarthritis, left knee: Secondary | ICD-10-CM | POA: Diagnosis not present

## 2021-05-19 DIAGNOSIS — Z791 Long term (current) use of non-steroidal anti-inflammatories (NSAID): Secondary | ICD-10-CM | POA: Insufficient documentation

## 2021-05-19 DIAGNOSIS — Z79899 Other long term (current) drug therapy: Secondary | ICD-10-CM | POA: Insufficient documentation

## 2021-05-19 DIAGNOSIS — Z9889 Other specified postprocedural states: Secondary | ICD-10-CM

## 2021-05-19 DIAGNOSIS — Z96652 Presence of left artificial knee joint: Secondary | ICD-10-CM | POA: Diagnosis not present

## 2021-05-19 DIAGNOSIS — Z471 Aftercare following joint replacement surgery: Secondary | ICD-10-CM | POA: Diagnosis not present

## 2021-05-19 DIAGNOSIS — K219 Gastro-esophageal reflux disease without esophagitis: Secondary | ICD-10-CM | POA: Insufficient documentation

## 2021-05-19 HISTORY — PX: TOTAL KNEE ARTHROPLASTY: SHX125

## 2021-05-19 SURGERY — ARTHROPLASTY, KNEE, TOTAL
Anesthesia: Monitor Anesthesia Care | Site: Knee | Laterality: Left

## 2021-05-19 MED ORDER — PROPOFOL 1000 MG/100ML IV EMUL
INTRAVENOUS | Status: AC
  Filled 2021-05-19: qty 100

## 2021-05-19 MED ORDER — BUPIVACAINE IN DEXTROSE 0.75-8.25 % IT SOLN
INTRATHECAL | Status: DC | PRN
  Administered 2021-05-19: 1.8 mL via INTRATHECAL

## 2021-05-19 MED ORDER — FENTANYL CITRATE (PF) 100 MCG/2ML IJ SOLN
INTRAMUSCULAR | Status: DC | PRN
  Administered 2021-05-19: 50 ug via INTRAVENOUS

## 2021-05-19 MED ORDER — DEXMEDETOMIDINE (PRECEDEX) IN NS 20 MCG/5ML (4 MCG/ML) IV SYRINGE
PREFILLED_SYRINGE | INTRAVENOUS | Status: AC
  Filled 2021-05-19: qty 5

## 2021-05-19 MED ORDER — ONDANSETRON HCL 4 MG PO TABS: 4.0000 mg | ORAL_TABLET | Freq: Three times a day (TID) | ORAL | 0 refills | Status: AC | PRN

## 2021-05-19 MED ORDER — CEFAZOLIN SODIUM-DEXTROSE 2-4 GM/100ML-% IV SOLN
2.0000 g | INTRAVENOUS | Status: AC
  Administered 2021-05-19: 12:00:00 2 g via INTRAVENOUS
  Filled 2021-05-19: qty 100

## 2021-05-19 MED ORDER — BUPIVACAINE LIPOSOME 1.3 % IJ SUSP
INTRAMUSCULAR | Status: AC
  Filled 2021-05-19: qty 20

## 2021-05-19 MED ORDER — DEXAMETHASONE SODIUM PHOSPHATE 10 MG/ML IJ SOLN
8.0000 mg | Freq: Once | INTRAMUSCULAR | Status: DC
  Filled 2021-05-19: qty 1

## 2021-05-19 MED ORDER — CEFADROXIL 500 MG PO CAPS: 500.0000 mg | ORAL_CAPSULE | Freq: Two times a day (BID) | ORAL | 0 refills | Status: DC

## 2021-05-19 MED ORDER — LACTATED RINGERS IV SOLN: INTRAVENOUS | Status: DC

## 2021-05-19 MED ORDER — LIDOCAINE 2% (20 MG/ML) 5 ML SYRINGE
INTRAMUSCULAR | Status: AC
  Filled 2021-05-19: qty 10

## 2021-05-19 MED ORDER — BUPIVACAINE LIPOSOME 1.3 % IJ SUSP
INTRAMUSCULAR | Status: DC | PRN
  Administered 2021-05-19: 20 mL

## 2021-05-19 MED ORDER — OXYCODONE HCL 5 MG/5ML PO SOLN: 5.0000 mg | Freq: Once | ORAL | Status: DC | PRN

## 2021-05-19 MED ORDER — ONDANSETRON HCL 4 MG/2ML IJ SOLN
INTRAMUSCULAR | Status: AC
  Filled 2021-05-19: qty 2

## 2021-05-19 MED ORDER — PROMETHAZINE HCL 25 MG/ML IJ SOLN: 6.2500 mg | INTRAMUSCULAR | Status: DC | PRN

## 2021-05-19 MED ORDER — SODIUM CHLORIDE 0.9 % IR SOLN
Status: DC | PRN
  Administered 2021-05-19: 3000 mL

## 2021-05-19 MED ORDER — PROPOFOL 500 MG/50ML IV EMUL: INTRAVENOUS | Status: DC | PRN

## 2021-05-19 MED ORDER — ROCURONIUM BROMIDE 10 MG/ML (PF) SYRINGE
PREFILLED_SYRINGE | INTRAVENOUS | Status: AC
  Filled 2021-05-19: qty 10

## 2021-05-19 MED ORDER — POVIDONE-IODINE 10 % EX SWAB: 2.0000 "application " | Freq: Once | CUTANEOUS | Status: AC

## 2021-05-19 MED ORDER — TRANEXAMIC ACID-NACL 1000-0.7 MG/100ML-% IV SOLN
1000.0000 mg | INTRAVENOUS | Status: AC
  Administered 2021-05-19: 13:00:00 1000 mg via INTRAVENOUS
  Filled 2021-05-19: qty 100

## 2021-05-19 MED ORDER — PROPOFOL 500 MG/50ML IV EMUL
INTRAVENOUS | Status: DC | PRN
Start: 2021-05-19 — End: 2021-05-19
  Administered 2021-05-19: 150 ug/kg/min via INTRAVENOUS

## 2021-05-19 MED ORDER — FENTANYL CITRATE (PF) 100 MCG/2ML IJ SOLN
INTRAMUSCULAR | Status: AC
  Administered 2021-05-19: 11:00:00 50 ug
  Filled 2021-05-19: qty 2

## 2021-05-19 MED ORDER — BUPIVACAINE LIPOSOME 1.3 % IJ SUSP: 20.0000 mL | Freq: Once | INTRAMUSCULAR | Status: DC

## 2021-05-19 MED ORDER — BUPIVACAINE HCL 0.5 % IJ SOLN
INTRAMUSCULAR | Status: DC | PRN
  Administered 2021-05-19: 20 mL

## 2021-05-19 MED ORDER — OXYCODONE HCL 5 MG PO TABS: 5.0000 mg | ORAL_TABLET | ORAL | 0 refills | Status: AC | PRN

## 2021-05-19 MED ORDER — DEXAMETHASONE SODIUM PHOSPHATE 10 MG/ML IJ SOLN
INTRAMUSCULAR | Status: AC
  Filled 2021-05-19: qty 1

## 2021-05-19 MED ORDER — OXYCODONE HCL 5 MG PO TABS: 5.0000 mg | ORAL_TABLET | ORAL | 0 refills | Status: DC | PRN

## 2021-05-19 MED ORDER — MIDAZOLAM HCL 2 MG/2ML IJ SOLN
INTRAMUSCULAR | Status: AC
  Administered 2021-05-19: 11:00:00 1 mg
  Filled 2021-05-19: qty 2

## 2021-05-19 MED ORDER — FENTANYL CITRATE PF 50 MCG/ML IJ SOSY
50.0000 ug | PREFILLED_SYRINGE | Freq: Once | INTRAMUSCULAR | Status: DC
  Filled 2021-05-19: qty 1

## 2021-05-19 MED ORDER — DEXAMETHASONE SODIUM PHOSPHATE 10 MG/ML IJ SOLN
INTRAMUSCULAR | Status: DC | PRN
  Administered 2021-05-19: 10 mg
  Administered 2021-05-19: 8 mg

## 2021-05-19 MED ORDER — PHENYLEPHRINE 40 MCG/ML (10ML) SYRINGE FOR IV PUSH (FOR BLOOD PRESSURE SUPPORT)
PREFILLED_SYRINGE | INTRAVENOUS | Status: DC | PRN
  Administered 2021-05-19: 40 ug via INTRAVENOUS
  Administered 2021-05-19: 120 ug via INTRAVENOUS
  Administered 2021-05-19: 80 ug via INTRAVENOUS
  Administered 2021-05-19: 40 ug via INTRAVENOUS

## 2021-05-19 MED ORDER — ACETAMINOPHEN 500 MG PO TABS: 1000.0000 mg | ORAL_TABLET | Freq: Once | ORAL | Status: DC

## 2021-05-19 MED ORDER — AMISULPRIDE (ANTIEMETIC) 5 MG/2ML IV SOLN: 10.0000 mg | Freq: Once | INTRAVENOUS | Status: DC | PRN

## 2021-05-19 MED ORDER — CELECOXIB 200 MG PO CAPS
400.0000 mg | ORAL_CAPSULE | Freq: Once | ORAL | Status: AC
  Administered 2021-05-19: 11:00:00 400 mg via ORAL
  Filled 2021-05-19: qty 2

## 2021-05-19 MED ORDER — 0.9 % SODIUM CHLORIDE (POUR BTL) OPTIME
TOPICAL | Status: DC | PRN
  Administered 2021-05-19: 13:00:00 1000 mL

## 2021-05-19 MED ORDER — PROPOFOL 10 MG/ML IV BOLUS
INTRAVENOUS | Status: AC
  Filled 2021-05-19: qty 20

## 2021-05-19 MED ORDER — HYDROMORPHONE HCL 1 MG/ML IJ SOLN: 0.2500 mg | INTRAMUSCULAR | Status: DC | PRN

## 2021-05-19 MED ORDER — ASPIRIN EC 81 MG PO TBEC: 81.0000 mg | DELAYED_RELEASE_TABLET | Freq: Two times a day (BID) | ORAL | 0 refills | Status: AC

## 2021-05-19 MED ORDER — FENTANYL CITRATE (PF) 250 MCG/5ML IJ SOLN
INTRAMUSCULAR | Status: AC
  Filled 2021-05-19: qty 5

## 2021-05-19 MED ORDER — CEFADROXIL 500 MG PO CAPS: 500.0000 mg | ORAL_CAPSULE | Freq: Two times a day (BID) | ORAL | 0 refills | Status: AC

## 2021-05-19 MED ORDER — CHLORHEXIDINE GLUCONATE 0.12 % MT SOLN
15.0000 mL | Freq: Once | OROMUCOSAL | Status: AC
  Administered 2021-05-19: 11:00:00 15 mL via OROMUCOSAL
  Filled 2021-05-19: qty 15

## 2021-05-19 MED ORDER — ACETAMINOPHEN 500 MG PO TABS
1000.0000 mg | ORAL_TABLET | Freq: Once | ORAL | Status: DC
  Filled 2021-05-19: qty 2

## 2021-05-19 MED ORDER — PHENYLEPHRINE HCL-NACL 20-0.9 MG/250ML-% IV SOLN
INTRAVENOUS | Status: DC | PRN
Start: 2021-05-19 — End: 2021-05-19
  Administered 2021-05-19: 20 ug/min via INTRAVENOUS

## 2021-05-19 MED ORDER — PROPOFOL 10 MG/ML IV BOLUS
INTRAVENOUS | Status: DC | PRN
Start: 2021-05-19 — End: 2021-05-19
  Administered 2021-05-19: 10 mg via INTRAVENOUS
  Administered 2021-05-19 (×2): 20 mg via INTRAVENOUS
  Administered 2021-05-19: 10 mg via INTRAVENOUS
  Administered 2021-05-19 (×2): 20 mg via INTRAVENOUS

## 2021-05-19 MED ORDER — ACETAMINOPHEN 500 MG PO TABS: 1000.0000 mg | ORAL_TABLET | Freq: Three times a day (TID) | ORAL | 0 refills | Status: AC | PRN

## 2021-05-19 MED ORDER — OXYCODONE HCL 5 MG PO TABS: 5.0000 mg | ORAL_TABLET | Freq: Once | ORAL | Status: DC | PRN

## 2021-05-19 MED ORDER — ORAL CARE MOUTH RINSE: 15.0000 mL | Freq: Once | OROMUCOSAL | Status: AC

## 2021-05-19 MED ORDER — SODIUM CHLORIDE 0.9% FLUSH
INTRAVENOUS | Status: DC | PRN
  Administered 2021-05-19: 60 mL via INTRAVENOUS

## 2021-05-19 MED ORDER — POVIDONE-IODINE 10 % EX SWAB
2.0000 "application " | Freq: Once | CUTANEOUS | Status: AC
  Administered 2021-05-19: 2 via TOPICAL

## 2021-05-19 MED ORDER — MIDAZOLAM HCL 2 MG/2ML IJ SOLN
1.0000 mg | Freq: Once | INTRAMUSCULAR | Status: DC
  Filled 2021-05-19: qty 1

## 2021-05-19 SURGICAL SUPPLY — 75 items
BAG COUNTER SPONGE SURGICOUNT (BAG) ×2 IMPLANT
BANDAGE ESMARK 6X9 LF (GAUZE/BANDAGES/DRESSINGS) ×1 IMPLANT
BLADE SAG 18X100X1.27 (BLADE) ×5 IMPLANT
BLADE SAW SAG 35X64 .89 (BLADE) ×1 IMPLANT
BNDG ELASTIC 6X10 VLCR STRL LF (GAUZE/BANDAGES/DRESSINGS) ×2 IMPLANT
BNDG ELASTIC 6X15 VLCR STRL LF (GAUZE/BANDAGES/DRESSINGS) ×2 IMPLANT
BNDG ESMARK 6X9 LF (GAUZE/BANDAGES/DRESSINGS) ×2
BOWL SMART MIX CTS (DISPOSABLE) ×2 IMPLANT
CEMENT BONE R 1X40 (Cement) ×2 IMPLANT
CLSR STERI-STRIP ANTIMIC 1/2X4 (GAUZE/BANDAGES/DRESSINGS) ×4 IMPLANT
COMP FEM CEMT PERS SZ 7 LT (Joint) ×2 IMPLANT
COMPONENT FEM CMT PERS SZ7 LT (Joint) IMPLANT
COVER SURGICAL LIGHT HANDLE (MISCELLANEOUS) ×2 IMPLANT
CUFF TOURN SGL QUICK 34 (TOURNIQUET CUFF) ×1
CUFF TRNQT CYL 34X4.125X (TOURNIQUET CUFF) ×1 IMPLANT
DERMABOND ADVANCED (GAUZE/BANDAGES/DRESSINGS) ×1
DERMABOND ADVANCED .7 DNX12 (GAUZE/BANDAGES/DRESSINGS) IMPLANT
DRAPE EXTREMITY T 121X128X90 (DISPOSABLE) ×2 IMPLANT
DRAPE HALF SHEET 40X57 (DRAPES) ×2 IMPLANT
DRAPE IMP U-DRAPE 54X76 (DRAPES) ×2 IMPLANT
DRAPE U-SHAPE 47X51 STRL (DRAPES) ×2 IMPLANT
DRSG AQUACEL AG ADV 3.5X10 (GAUZE/BANDAGES/DRESSINGS) ×1 IMPLANT
DRSG MEPILEX BORDER 4X12 (GAUZE/BANDAGES/DRESSINGS) ×1 IMPLANT
DRSG MEPILEX BORDER 4X8 (GAUZE/BANDAGES/DRESSINGS) ×1 IMPLANT
DURAPREP 26ML APPLICATOR (WOUND CARE) ×4 IMPLANT
ELECT CAUTERY BLADE 6.4 (BLADE) ×2 IMPLANT
ELECT REM PT RETURN 9FT ADLT (ELECTROSURGICAL) ×2
ELECTRODE REM PT RTRN 9FT ADLT (ELECTROSURGICAL) ×1 IMPLANT
FACESHIELD WRAPAROUND (MASK) ×4 IMPLANT
FACESHIELD WRAPAROUND OR TEAM (MASK) ×2 IMPLANT
GLOVE SRG 8 PF TXTR STRL LF DI (GLOVE) ×1 IMPLANT
GLOVE SURG ENC MOIS LTX SZ7.5 (GLOVE) ×2 IMPLANT
GLOVE SURG SYN 7.5  E (GLOVE) ×1
GLOVE SURG SYN 7.5 E (GLOVE) ×1 IMPLANT
GLOVE SURG SYN 7.5 PF PI (GLOVE) ×1 IMPLANT
GLOVE SURG UNDER POLY LF SZ7.5 (GLOVE) ×2 IMPLANT
GLOVE SURG UNDER POLY LF SZ8 (GLOVE) ×1
GOWN STRL REUS W/ TWL LRG LVL3 (GOWN DISPOSABLE) ×1 IMPLANT
GOWN STRL REUS W/ TWL XL LVL3 (GOWN DISPOSABLE) ×2 IMPLANT
GOWN STRL REUS W/TWL LRG LVL3 (GOWN DISPOSABLE) ×1
GOWN STRL REUS W/TWL XL LVL3 (GOWN DISPOSABLE) ×2
HANDPIECE INTERPULSE COAX TIP (DISPOSABLE)
HDLS TROCR DRIL PIN KNEE 75 (PIN) ×1
IMMOBILIZER KNEE 22 UNIV (SOFTGOODS) ×2 IMPLANT
INSERT ARTISURF SZ 6-7 LT (Insert) ×1 IMPLANT
KIT BASIN OR (CUSTOM PROCEDURE TRAY) ×2 IMPLANT
KIT TURNOVER KIT B (KITS) ×2 IMPLANT
MANIFOLD NEPTUNE II (INSTRUMENTS) ×2 IMPLANT
NDL 18GX1X1/2 (RX/OR ONLY) (NEEDLE) ×1 IMPLANT
NEEDLE 18GX1X1/2 (RX/OR ONLY) (NEEDLE) ×2 IMPLANT
NS IRRIG 1000ML POUR BTL (IV SOLUTION) ×2 IMPLANT
PACK TOTAL JOINT (CUSTOM PROCEDURE TRAY) ×2 IMPLANT
PAD ARMBOARD 7.5X6 YLW CONV (MISCELLANEOUS) ×2 IMPLANT
PIN DRILL HDLS TROCAR 75 4PK (PIN) IMPLANT
SCREW HEADED 33MM KNEE (MISCELLANEOUS) ×2 IMPLANT
SCREW HEADED 48MM KNEE (MISCELLANEOUS) ×2 IMPLANT
SET HNDPC FAN SPRY TIP SCT (DISPOSABLE) IMPLANT
STEM POLY PAT PLY 32M KNEE (Knees) ×1 IMPLANT
STEM TIBIA 5 DEG SZ D L KNEE (Knees) IMPLANT
STRIP CLOSURE SKIN 1/2X4 (GAUZE/BANDAGES/DRESSINGS) ×1 IMPLANT
SUCTION FRAZIER HANDLE 10FR (MISCELLANEOUS) ×1
SUCTION TUBE FRAZIER 10FR DISP (MISCELLANEOUS) ×1 IMPLANT
SUT MNCRL AB 4-0 PS2 18 (SUTURE) ×2 IMPLANT
SUT MON AB 2-0 CT1 27 (SUTURE) ×2 IMPLANT
SUT VIC AB 0 CT1 27 (SUTURE) ×1
SUT VIC AB 0 CT1 27XBRD ANBCTR (SUTURE) ×1 IMPLANT
SUT VIC AB 1 CT1 27 (SUTURE) ×2
SUT VIC AB 1 CT1 27XBRD ANBCTR (SUTURE) ×2 IMPLANT
SYR 50ML LL SCALE MARK (SYRINGE) ×2 IMPLANT
TIBIA STEM 5 DEG SZ D L KNEE (Knees) ×2 IMPLANT
TOWEL GREEN STERILE (TOWEL DISPOSABLE) ×2 IMPLANT
TOWEL GREEN STERILE FF (TOWEL DISPOSABLE) ×2 IMPLANT
TRAY CATH 16FR W/PLASTIC CATH (SET/KITS/TRAYS/PACK) IMPLANT
TRAY FOLEY BAG SILVER LF 14FR (CATHETERS) IMPLANT
WRAP KNEE MAXI GEL POST OP (GAUZE/BANDAGES/DRESSINGS) ×1 IMPLANT

## 2021-05-19 NOTE — Discharge Instructions (Signed)
INSTRUCTIONS AFTER JOINT REPLACEMENT  ° °Remove items at home which could result in a fall. This includes throw rugs or furniture in walking pathways °ICE to the affected joint every three hours while awake for 30 minutes at a time, for at least the first 3-5 days, and then as needed for pain and swelling.  Continue to use ice for pain and swelling. You may notice swelling that will progress down to the foot and ankle.  This is normal after surgery.  Elevate your leg when you are not up walking on it.   °Continue to use the breathing machine you got in the hospital (incentive spirometer) which will help keep your temperature down.  It is common for your temperature to cycle up and down following surgery, especially at night when you are not up moving around and exerting yourself.  The breathing machine keeps your lungs expanded and your temperature down. ° ° °DIET:  As you were doing prior to hospitalization, we recommend a well-balanced diet. ° °DRESSING / WOUND CARE / SHOWERING ° °Keep the surgical dressing until follow up.  The dressing is water proof, so you can shower without any extra covering.  IF THE DRESSING FALLS OFF or the wound gets wet inside, change the dressing with sterile gauze.  Please use good hand washing techniques before changing the dressing.  Do not use any lotions or creams on the incision until instructed by your surgeon.   ° °ACTIVITY ° °Increase activity slowly as tolerated, but follow the weight bearing instructions below.   °No driving for 6 weeks or until further direction given by your physician.  You cannot drive while taking narcotics.  °No lifting or carrying greater than 10 lbs. until further directed by your surgeon. °Avoid periods of inactivity such as sitting longer than an hour when not asleep. This helps prevent blood clots.  °You may return to work once you are authorized by your doctor.  ° ° ° °WEIGHT BEARING  ° °Weight bearing as tolerated with assist device (walker, cane,  etc) as directed, use it as long as suggested by your surgeon or therapist, typically at least 4-6 weeks. ° ° °EXERCISES ° °Results after joint replacement surgery are often greatly improved when you follow the exercise, range of motion and muscle strengthening exercises prescribed by your doctor. Safety measures are also important to protect the joint from further injury. Any time any of these exercises cause you to have increased pain or swelling, decrease what you are doing until you are comfortable again and then slowly increase them. If you have problems or questions, call your caregiver or physical therapist for advice.  ° °Rehabilitation is important following a joint replacement. After just a few days of immobilization, the muscles of the leg can become weakened and shrink (atrophy).  These exercises are designed to build up the tone and strength of the thigh and leg muscles and to improve motion. Often times heat used for twenty to thirty minutes before working out will loosen up your tissues and help with improving the range of motion but do not use heat for the first two weeks following surgery (sometimes heat can increase post-operative swelling).  ° °These exercises can be done on a training (exercise) mat, on the floor, on a table or on a bed. Use whatever works the best and is most comfortable for you.    Use music or television while you are exercising so that the exercises are a pleasant break in your   day. This will make your life better with the exercises acting as a break in your routine that you can look forward to.   Perform all exercises about fifteen times, three times per day or as directed.  You should exercise both the operative leg and the other leg as well.  Exercises include:   Quad Sets - Tighten up the muscle on the front of the thigh (Quad) and hold for 5-10 seconds.   Straight Leg Raises - With your knee straight (if you were given a brace, keep it on), lift the leg to 60  degrees, hold for 3 seconds, and slowly lower the leg.  Perform this exercise against resistance later as your leg gets stronger.  Leg Slides: Lying on your back, slowly slide your foot toward your buttocks, bending your knee up off the floor (only go as far as is comfortable). Then slowly slide your foot back down until your leg is flat on the floor again.  Angel Wings: Lying on your back spread your legs to the side as far apart as you can without causing discomfort.  Hamstring Strength:  Lying on your back, push your heel against the floor with your leg straight by tightening up the muscles of your buttocks.  Repeat, but this time bend your knee to a comfortable angle, and push your heel against the floor.  You may put a pillow under the heel to make it more comfortable if necessary.   A rehabilitation program following joint replacement surgery can speed recovery and prevent re-injury in the future due to weakened muscles. Contact your doctor or a physical therapist for more information on knee rehabilitation.    CONSTIPATION  Constipation is defined medically as fewer than three stools per week and severe constipation as less than one stool per week.  Even if you have a regular bowel pattern at home, your normal regimen is likely to be disrupted due to multiple reasons following surgery.  Combination of anesthesia, postoperative narcotics, change in appetite and fluid intake all can affect your bowels.   YOU MUST use at least one of the following options; they are listed in order of increasing strength to get the job done.  They are all available over the counter, and you may need to use some, POSSIBLY even all of these options:    Drink plenty of fluids (prune juice may be helpful) and high fiber foods Colace 100 mg by mouth twice a day  Senokot for constipation as directed and as needed Dulcolax (bisacodyl), take with full glass of water  Miralax (polyethylene glycol) once or twice a day as  needed.  If you have tried all these things and are unable to have a bowel movement in the first 3-4 days after surgery call either your surgeon or your primary doctor.    If you experience loose stools or diarrhea, hold the medications until you stool forms back up.  If your symptoms do not get better within 1 week or if they get worse, check with your doctor.  If you experience "the worst abdominal pain ever" or develop nausea or vomiting, please contact the office immediately for further recommendations for treatment.   ITCHING:  If you experience itching with your medications, try taking only a single pain pill, or even half a pain pill at a time.  You can also use Benadryl over the counter for itching or also to help with sleep.   TED HOSE STOCKINGS:  Use stockings on both  legs until for at least 2 weeks or as directed by physician office. They may be removed at night for sleeping.  MEDICATIONS:  See your medication summary on the "After Visit Summary" that nursing will review with you.  You may have some home medications which will be placed on hold until you complete the course of blood thinner medication.  It is important for you to complete the blood thinner medication as prescribed.   Blood clot prevention (DVT Prophylaxis): After surgery you are at an increased risk for a blood clot. you were prescribed a blood thinner, Aspirin 81mg, to be taken twice daily for a total of 4 weeks from surgery to help reduce your risk of getting a blood clot. This will help prevent a blood clot. Signs of a pulmonary embolus (blood clot in the lungs) include sudden short of breath, feeling lightheaded or dizzy, chest pain with a deep breath, rapid pulse rapid breathing. Signs of a blood clot in your arms or legs include new unexplained swelling and cramping, warm, red or darkened skin around the painful area. Please call the office or 911 right away if these signs or symptoms develop.  PRECAUTIONS:  If you  experience chest pain or shortness of breath - call 911 immediately for transfer to the hospital emergency department.   If you develop a fever greater that 101 F, purulent drainage from wound, increased redness or drainage from wound, foul odor from the wound/dressing, or calf pain - CONTACT YOUR SURGEON.                                                   FOLLOW-UP APPOINTMENTS:  If you do not already have a post-op appointment, please call the office for an appointment to be seen by your surgeon.  Guidelines for how soon to be seen are listed in your "After Visit Summary", but are typically between 2-3 weeks after surgery.  OTHER INSTRUCTIONS:   Knee Replacement:  Do not place pillow under knee, focus on keeping the knee straight while resting. CPM instructions: 0-90 degrees, 2 hours in the morning, 2 hours in the afternoon, and 2 hours in the evening. Place foam block, curve side up under heel at all times except when in CPM or when walking.  DO NOT modify, tear, cut, or change the foam block in any way.  POST-OPERATIVE OPIOID TAPER INSTRUCTIONS: It is important to wean off of your opioid medication as soon as possible. If you do not need pain medication after your surgery it is ok to stop day one. Opioids include: Codeine, Hydrocodone(Norco, Vicodin), Oxycodone(Percocet, oxycontin) and hydromorphone amongst others.  Long term and even short term use of opiods can cause: Increased pain response Dependence Constipation Depression Respiratory depression And more.  Withdrawal symptoms can include Flu like symptoms Nausea, vomiting And more Techniques to manage these symptoms Hydrate well Eat regular healthy meals Stay active Use relaxation techniques(deep breathing, meditating, yoga) Do Not substitute Alcohol to help with tapering If you have been on opioids for less than two weeks and do not have pain than it is ok to stop all together.  Plan to wean off of opioids This plan should  start within one week post op of your joint replacement. Maintain the same interval or time between taking each dose and first decrease the dose.  Cut the   total daily intake of opioids by one tablet each day Next start to increase the time between doses. The last dose that should be eliminated is the evening dose.   MAKE SURE YOU:  Understand these instructions.  Get help right away if you are not doing well or get worse.    Thank you for letting us be a part of your medical care team.  It is a privilege we respect greatly.  We hope these instructions will help you stay on track for a fast and full recovery!        

## 2021-05-19 NOTE — Op Note (Signed)
DATE OF SURGERY:  05/19/2021 TIME: 2:14 PM  PATIENT NAME:  Yvonne White   AGE: 76 y.o.    PRE-OPERATIVE DIAGNOSIS: Left knee end-stage osteoarthritis  POST-OPERATIVE DIAGNOSIS:  Same  PROCEDURE: Left total Knee Arthroplasty  SURGEON:  Marget Outten A Kacy Conely, MD   ASSISTANT: Izola Price, RNFA, present and scrubbed throughout the case, critical for assistance with exposure, retraction, instrumentation, and closure.   OPERATIVE IMPLANTS:  Zimmer persona: Femur size: 7 narrow, tibia: D, patella: 46mm, 10 mm MC polyethylene bearing Implant Name Type Inv. Item Serial No. Manufacturer Lot No. LRB No. Used Action  CEMENT BONE R 1X40 - [id number] Cement CEMENT BONE R 1X40  ZIMMER RECON(ORTH,TRAU,BIO,SG) [id number] Left 1 Implanted  CEMENT BONE R 1X40 - [id number] Cement CEMENT BONE R 1X40  ZIMMER RECON(ORTH,TRAU,BIO,SG) [id number] Left 1 Implanted  MIS QUAD-SPARING 48 MM LENGTH SCREW    BIOMET ZIMMER MICROFIXATION [id number] Left 1 Implanted  33MM LENGTH SCREW    ZIMMER KNEE [id number] Left 1 Implanted  33 MM LENGTH SCREW    ZIMMER KNEE [id number] Left 1 Implanted  48 MM LENTH SCREW    ZIMMER KNEE [id number] Left 1 Implanted  STEM POLY PAT PLY 45M KNEE - [id number] Knees STEM POLY PAT PLY 45M KNEE  ZIMMER RECON(ORTH,TRAU,BIO,SG) [id number] Left 1 Implanted  TIBIA STEM 5 DEG SZ D L KNEE - [id number] Knees TIBIA STEM 5 DEG SZ D L KNEE  ZIMMER RECON(ORTH,TRAU,BIO,SG) [id number] Left 1 Implanted  COMP FEM CEMT PERS SZ 7 LT - [id number] Joint COMP FEM CEMT PERS SZ 7 LT  ZIMMER RECON(ORTH,TRAU,BIO,SG) [id number] Left 1 Implanted  INSERT ARTISURF SZ 6-7 LT - [id number] Insert INSERT ARTISURF SZ 6-7 LT  ZIMMER RECON(ORTH,TRAU,BIO,SG) [id number] Left 1 Implanted      PREOPERATIVE INDICATIONS:  Yvonne White is a 76 y.o. year old female with end stage bone on bone degenerative arthritis of the knee who failed conservative treatment, including injections, antiinflammatories, activity modification, and assistive devices,  and had significant impairment of their activities of daily living, and elected for Total Knee Arthroplasty.   The risks, benefits, and alternatives were discussed at length including but not limited to the risks of infection, bleeding, nerve injury, stiffness, blood clots, the need for revision surgery, cardiopulmonary complications, among others, and they were willing to proceed.  ESTIMATED BLOOD LOSS: 25cc  OPERATIVE DESCRIPTION:   Once adequate anesthesia, preoperative antibiotics, 2 gm of ancef,1 gm of Tranexamic Acid, and 8 mg of Decadron administered, the patient was positioned supine with a left thigh tourniquet placed.  The left lower extremity was prepped and draped in sterile fashion.  A time-  out was performed identifying the patient, planned procedure, and the appropriate extremity.     The leg was  exsanguinated, tourniquet elevated to 250 mmHg.  A midline incision was   made followed by median parapatellar arthrotomy. Anterior horn of the medial meniscus was released and resected. A medial release was performed, the infrapatellar fat pad was resected with care taken to protect the patellar tendon. The suprapatellar fat was removed to exposed the distal anterior femur. The anterior horn of the lateral meniscus and ACL were released.    Following initial  exposure, attention was first to the femur.  The femoral   canal was opened with a drill, irrigated to try to prevent fat emboli.  An   intramedullary rod was passed set at 5 degrees valgus, 10 mm. The distal femur was resected.  Following this resection, the tibia was   subluxated  anteriorly.  Using the extramedullary guide, 10 mm of bone was resected off   the proximal lateral tibia.  We confirmed the gap would be   stable medially and laterally with a size 10 spacer block as well as confirmed that the tibial cut was perpendicular in the coronal plane, checking with an alignment rod.    Once this was done, the posterior femoral  referencing femoral sizer was placed under to the posterior condyles with 3 degrees of external rotational. The femur was sized to be a size 7 in the anterior-  posterior dimension. The   anterior, posterior, and  chamfer cuts were made without difficulty nor   notching making certain that I was along the anterior cortex to help   with flexion gap stability.  Next a laminar spreader was placed with the knee in flexion and the medial lateral menisci were resected.  5 cc of the Exparel mixture was injected in the medial side of the back of the knee and 3 cc in the lateral side.  1/2 inch curved osteotome was used to resect posterior osteophyte that was then removed with a pituitary rongeur.       At this point, the tibia was sized to be a size D.  The size D tray was   then pinned in position. Trial reduction was now carried with a 7 femur,  D tibia, a 10 mm MC insert.  Attention was next directed to the patella.  Precut  measurement was noted to be 19 mm.  I resected down to 13 mm and used a  63mm patellar button to restore patellar height as well as cover the cut surface.     The patella lug holes were drilled and a 32 mm patella poly trial was placed.    The knee was brought to full extension with good flexion stability with the patella   tracking through the trochlea without application of pressure.     Next the femoral component was again assessed and determined to be seated and appropriately lateralized.  The femoral lug holes were drilled.  The femoral component was then removed.Tibial component was again assessed and felt to be seated and appropriately rotated with the medial third of the tubercle. The tibia was then drilled, and keel punched.     Final components were  opened and regular cement was mixed.      Final implants were then  cemented onto cleaned and dried cut surfaces of bone with the knee brought to extension with a 10 mm MC poly.  The knee was irrigated with pulse lavage  normal saline.  The synovial lining was  then injected a dilute Exparel.      Once the cement had fully cured, excess cement was removed   throughout the knee.  I confirmed that I was satisfied with the range of   motion and stability, and the final 10 mm MC poly insert was chosen.  It was   placed into the knee.         The tourniquet had been let down at 69 minutes.  No significant   hemostasis was required.  The medial parapatellar arthrotomy was then reapproximated using #1 Stratafix sutures with the knee  in flexion.  The   remaining wound was closed with 0 Vicryl, 2-0 Vicryl, and running 3-0 Monocryl.   The knee was cleaned, dried, dressed sterilely using Dermabond and   Aquacel dressing.  The patient was then  brought to recovery  room in stable condition, tolerating the procedure  well. There were no complications.   Post op recs: WB: WBAT Abx: ancef, dc with 7 days cefadroxil extended abx prophylaxis given hx of left lower leg cellulitis Imaging: PACU xrays DVT prophylaxis: Aspirin 81mg  BID x4 weeks Follow up: 2 weeks after surgery for a wound check with Dr. Zachery Dakins at [facility].  Address: [address], [place], [place] [address]  Office Phone: [phone]  Charlies Constable, MD Orthopaedic Surgery

## 2021-05-19 NOTE — Progress Notes (Signed)
Patient in phase II waiting for physical therapy sign off. Vital signs stable. No further monitoring necessary.   Gabriel Rainwater, RN

## 2021-05-19 NOTE — Evaluation (Signed)
Physical Therapy Evaluation Patient Details Name: Yvonne White MRN: [id number] DOB: 1945/09/19 Today's Date: 05/19/2021  History of Present Illness  Pt is 76 yo female s/p L TKA on 05/19/21.  Pt with hx of OA and R TKA.  Clinical Impression  Pt is s/p TKA resulting in the deficits listed below (see PT Problem List). Pt seen in PACU for possible SDDC.  At baseline, pt very active and independent (enjoys spin class).  She had R TKA in past and did very well.  Today, pt presenting with excellent ROM, pain control, and quad activation.  She was able to ambulate 100' with RW and min guard.  Pt has good support and DME at home.  Pt demonstrates safe gait & transfers in order to return home from PT perspective once discharged by MD.  While in hospital, will continue to benefit from PT for skilled therapy to advance mobility and exercises.        Recommendations for follow up therapy are one component of a multi-disciplinary discharge planning process, led by the attending physician.  Recommendations may be updated based on patient status, additional functional criteria and insurance authorization.  Follow Up Recommendations Follow physician's recommendations for discharge plan and follow up therapies    Assistance Recommended at Discharge Intermittent Supervision/Assistance  Patient can return home with the following  A little help with walking and/or transfers;Help with stairs or ramp for entrance;A little help with bathing/dressing/bathroom    Equipment Recommendations None recommended by PT  Recommendations for Other Services       Functional Status Assessment Patient has had a recent decline in their functional status and demonstrates the ability to make significant improvements in function in a reasonable and predictable amount of time.     Precautions / Restrictions Precautions Precautions: Fall Restrictions Weight Bearing Restrictions: Yes LLE Weight Bearing: Weight bearing as  tolerated      Mobility  Bed Mobility Overal bed mobility: Needs Assistance Bed Mobility: Supine to Sit, Sit to Supine     Supine to sit: Supervision Sit to supine: Supervision        Transfers Overall transfer level: Needs assistance Equipment used: Rolling walker (2 wheels) Transfers: Sit to/from Stand Sit to Stand: Min guard           General transfer comment: min guard for safety with cues for hand placement    Ambulation/Gait Ambulation/Gait assistance: Min guard Gait Distance (Feet): 100 Feet Assistive device: Rolling walker (2 wheels) Gait Pattern/deviations: Step-to pattern, Decreased stride length, Decreased weight shift to left Gait velocity: decreased     General Gait Details: Pt started with large steps once out of room - cued for smaller steps for safety; step to L gait with good stability  Stairs Stairs:  (No stairs in PACU.  Pt only with 1 step to enter and verbalized correct sequencing.  Demonstrated march to simulate)          Wheelchair Mobility    Modified Rankin (Stroke Patients Only)       Balance Overall balance assessment: Needs assistance Sitting-balance support: No upper extremity supported Sitting balance-Leahy Scale: Normal     Standing balance support: Bilateral upper extremity supported, No upper extremity supported Standing balance-Leahy Scale: Fair Standing balance comment: Could static stand without AD ; RW to ambulate                             Pertinent Vitals/Pain Pain Assessment Pain  Assessment: No/denies pain    Home Living Family/patient expects to be discharged to:: Private residence Living Arrangements: Spouse/significant other Available Help at Discharge: Family;Available 24 hours/day Type of Home: House Home Access: Stairs to enter   [employer] of Steps: 1 at back no rail but can hold deck rail   Home Layout: Two level;Able to live on main level with bedroom/bathroom Home  Equipment: Rolling Walker (2 wheels);Cane - single point;BSC/3in1      Prior Function Prior Level of Function : Independent/Modified Independent;Driving                     Hand Dominance        Extremity/Trunk Assessment   Upper Extremity Assessment Upper Extremity Assessment: Overall WFL for tasks assessed    Lower Extremity Assessment Lower Extremity Assessment: LLE deficits/detail;RLE deficits/detail RLE Deficits / Details: ROM WFL; MMT 5/5 RLE Sensation: WNL LLE Deficits / Details: ROM: ankle and hip WFL, knee 0 to 100 degrees without difficulty; MMT : ankle 5/5, hip and knee 3/5 not further tested LLE Sensation: WNL    Cervical / Trunk Assessment Cervical / Trunk Assessment: Normal  Communication   Communication: No difficulties  Cognition Arousal/Alertness: Awake/alert Behavior During Therapy: WFL for tasks assessed/performed Overall Cognitive Status: Within Functional Limits for tasks assessed                                 General Comments: mildly impulsive - seems to be baseline , reports "go getter"  Educated on safe ice use, no pivots, car transfers, resting with leg straight,  Also, encouraged walking every 1-2 hours during day. Educated on HEP with focus on mobility the first weeks. Discussed doing exercises within pain control and if pain increasing could decreased ROM, reps, and stop exercises as needed. Encouraged to perform quad sets and ankle pumps frequently for blood flow and to promote full knee extension.         General Comments General comments (skin integrity, edema, etc.): VSS    Exercises Total Joint Exercises Ankle Circles/Pumps: AROM, Both, Supine, 5 reps Quad Sets: AROM, Both, 5 reps, Supine Heel Slides: AROM, 5 reps, Supine, Left Hip ABduction/ADduction: AROM, 5 reps, Supine, Left Long Arc Quad: AROM, Left, 5 reps, Supine Knee Flexion: AROM, Left, 5 reps, Supine Goniometric ROM: L knee 0 to 100 degrees    Assessment/Plan    PT Assessment Patient needs continued PT services  PT Problem List Decreased strength;Decreased mobility;Decreased range of motion;Decreased safety awareness;Decreased activity tolerance;Decreased balance;Decreased knowledge of use of DME;Pain       PT Treatment Interventions DME instruction;Therapeutic activities;Modalities;Gait training;Therapeutic exercise;Patient/family education;Stair training;Balance training;Functional mobility training    PT Goals (Current goals can be found in the Care Plan section)  Acute Rehab PT Goals Patient Stated Goal: return home today PT Goal Formulation: With patient/family Time For Goal Achievement: 06/02/21 Potential to Achieve Goals: Good    Frequency 7X/week     Co-evaluation               AM-PAC PT "6 Clicks" Mobility  Outcome Measure Help needed turning from your back to your side while in a flat bed without using bedrails?: None Help needed moving from lying on your back to sitting on the side of a flat bed without using bedrails?: A Little Help needed moving to and from a bed to a chair (including a wheelchair)?: A Little Help needed standing  up from a chair using your arms (e.g., wheelchair or bedside chair)?: A Little Help needed to walk in hospital room?: A Little Help needed climbing 3-5 steps with a railing? : A Little 6 Click Score: 19    End of Session Equipment Utilized During Treatment: Gait belt Activity Tolerance: Patient tolerated treatment well Patient left: in bed;with call bell/phone within reach Nurse Communication: Mobility status PT Visit Diagnosis: Other abnormalities of gait and mobility (R26.89);Muscle weakness (generalized) (M62.81)    Time: 9009-2004 PT Time Calculation (min) (ACUTE ONLY): 24 min   Charges:   PT Evaluation $PT Eval Low Complexity: 1 Low PT Treatments $Therapeutic Exercise: 8-22 mins        Abran Richard, PT Acute Rehab Services Pager [phone] [facility]  Rehab [phone]   Karlton Lemon 05/19/2021, 6:22 PM

## 2021-05-19 NOTE — Anesthesia Procedure Notes (Signed)
Anesthesia Regional Block: Adductor canal block   Pre-Anesthetic Checklist: , timeout performed,  Correct Patient, Correct Site, Correct Laterality,  Correct Procedure, Correct Position, site marked,  Risks and benefits discussed,  Surgical consent,  Pre-op evaluation,  At surgeon's request and post-op pain management  Laterality: Left  Prep: Maximum Sterile Barrier Precautions used, chloraprep       Needles:  Injection technique: Single-shot  Needle Type: Echogenic Stimulator Needle     Needle Length: 9cm  Needle Gauge: 22     Additional Needles:   Procedures:,,,, ultrasound used (permanent image in chart),,    Narrative:  Start time: 05/19/2021 11:15 AM End time: 05/19/2021 11:20 AM Injection made incrementally with aspirations every 5 mL.  Performed by: Personally  Anesthesiologist: Pervis Hocking, DO  Additional Notes: Monitors applied. No increased pain on injection. No increased resistance to injection. Injection made in 5cc increments. Good needle visualization. Patient tolerated procedure well.

## 2021-05-19 NOTE — Anesthesia Postprocedure Evaluation (Signed)
Anesthesia Post Note  Patient: Yvonne White  Procedure(s) Performed: TOTAL KNEE ARTHROPLASTY (Left: Knee)     Patient location during evaluation: PACU Anesthesia Type: Regional, MAC and Spinal Level of consciousness: awake and alert and oriented Pain management: pain level controlled Vital Signs Assessment: post-procedure vital signs reviewed and stable Respiratory status: spontaneous breathing, nonlabored ventilation and respiratory function stable Cardiovascular status: blood pressure returned to baseline and stable Postop Assessment: no headache, no backache, spinal receding and no apparent nausea or vomiting Anesthetic complications: no   No notable events documented.  Last Vitals:  Vitals:   05/19/21 1540 05/19/21 1555  BP: 112/64 117/69  Pulse: 74 75  Resp: (!) 22 17  Temp:  36.4 C  SpO2: 97% 98%    Last Pain:  Vitals:   05/19/21 1555  TempSrc:   PainSc: 0-No pain                 Pervis Hocking

## 2021-05-19 NOTE — Interval H&P Note (Signed)
The patient has been re-examined, and the chart reviewed, and there have been no interval changes to the documented history and physical.    The operative side was examined and the patient was confirmed to have. Sens DPN, SPN, TN intact, Motor EHL, ext, flex 5/5, and DP 2+, PT 2+, No significant edema. No signs of cellulitis on the lower leg.   The risks, benefits, and alternatives have been discussed at length with patient, and the patient is willing to proceed.  Left knee marked. Consent has been signed.

## 2021-05-19 NOTE — Anesthesia Procedure Notes (Signed)
Spinal  Patient location during procedure: OR Start time: 05/19/2021 12:17 PM End time: 05/19/2021 12:33 PM Reason for block: surgical anesthesia Staffing Performed: anesthesiologist  Anesthesiologist: Pervis Hocking, DO Preanesthetic Checklist Completed: patient identified, IV checked, risks and benefits discussed, surgical consent, monitors and equipment checked, pre-op evaluation and timeout performed Spinal Block Patient position: sitting Prep: DuraPrep and site prepped and draped Patient monitoring: cardiac monitor, continuous pulse ox and blood pressure Approach: midline Location: L2-3 Injection technique: single-shot Needle Needle type: Pencan  Needle gauge: 24 G Needle length: 9 cm Assessment Sensory level: T6 Events: CSF return Additional Notes Functioning IV was confirmed and monitors were applied. Sterile prep and drape, including hand hygiene and sterile gloves were used. The patient was positioned and the spine was prepped. The skin was anesthetized with lidocaine.  Free flow of clear CSF was obtained prior to injecting local anesthetic into the CSF.  The spinal needle aspirated freely following injection.  The needle was carefully withdrawn.  The patient tolerated the procedure well.   Technically challenging spinal d/t severe scoliosis

## 2021-05-19 NOTE — Transfer of Care (Signed)
Immediate Anesthesia Transfer of Care Note  Patient: Yvonne White  Procedure(s) Performed: TOTAL KNEE ARTHROPLASTY (Left: Knee)  Patient Location: PACU  Anesthesia Type:MAC and Spinal  Level of Consciousness: awake, alert  and oriented  Airway & Oxygen Therapy: Patient Spontanous Breathing  Post-op Assessment: Report given to RN  Post vital signs: Reviewed and stable  Last Vitals:  Vitals Value Taken Time  BP 102/61   Temp    Pulse 79 05/19/21 1455  Resp 16 05/19/21 1455  SpO2 95 % 05/19/21 1455  Vitals shown include unvalidated device data.  Last Pain:  Vitals:   05/19/21 1030  TempSrc:   PainSc: 0-No pain         Complications: No notable events documented.

## 2021-05-19 NOTE — Anesthesia Procedure Notes (Signed)
Procedure Name: MAC Date/Time: 05/19/2021 12:44 PM Performed by: Barrington Ellison, CRNA Pre-anesthesia Checklist: Patient identified, Emergency Drugs available, Suction available, Patient being monitored and Timeout performed Patient Re-evaluated:Patient Re-evaluated prior to induction Oxygen Delivery Method: Simple face mask

## 2021-05-20 ENCOUNTER — Encounter (HOSPITAL_COMMUNITY): Payer: Self-pay | Admitting: Orthopedic Surgery

## 2021-05-21 DIAGNOSIS — M25562 Pain in left knee: Secondary | ICD-10-CM | POA: Diagnosis not present

## 2021-05-21 DIAGNOSIS — R2689 Other abnormalities of gait and mobility: Secondary | ICD-10-CM | POA: Diagnosis not present

## 2021-05-21 DIAGNOSIS — Z96652 Presence of left artificial knee joint: Secondary | ICD-10-CM | POA: Diagnosis not present

## 2021-05-24 DIAGNOSIS — R2689 Other abnormalities of gait and mobility: Secondary | ICD-10-CM | POA: Diagnosis not present

## 2021-05-24 DIAGNOSIS — Z96652 Presence of left artificial knee joint: Secondary | ICD-10-CM | POA: Diagnosis not present

## 2021-05-24 DIAGNOSIS — M25562 Pain in left knee: Secondary | ICD-10-CM | POA: Diagnosis not present

## 2021-05-26 DIAGNOSIS — R2689 Other abnormalities of gait and mobility: Secondary | ICD-10-CM | POA: Diagnosis not present

## 2021-05-26 DIAGNOSIS — M25562 Pain in left knee: Secondary | ICD-10-CM | POA: Diagnosis not present

## 2021-05-26 DIAGNOSIS — Z96652 Presence of left artificial knee joint: Secondary | ICD-10-CM | POA: Diagnosis not present

## 2021-05-31 DIAGNOSIS — Z96652 Presence of left artificial knee joint: Secondary | ICD-10-CM | POA: Diagnosis not present

## 2021-05-31 DIAGNOSIS — R2689 Other abnormalities of gait and mobility: Secondary | ICD-10-CM | POA: Diagnosis not present

## 2021-05-31 DIAGNOSIS — M25562 Pain in left knee: Secondary | ICD-10-CM | POA: Diagnosis not present

## 2021-06-01 DIAGNOSIS — M1712 Unilateral primary osteoarthritis, left knee: Secondary | ICD-10-CM | POA: Diagnosis not present

## 2021-06-07 DIAGNOSIS — R2689 Other abnormalities of gait and mobility: Secondary | ICD-10-CM | POA: Diagnosis not present

## 2021-06-07 DIAGNOSIS — Z96652 Presence of left artificial knee joint: Secondary | ICD-10-CM | POA: Diagnosis not present

## 2021-06-07 DIAGNOSIS — M25562 Pain in left knee: Secondary | ICD-10-CM | POA: Diagnosis not present

## 2021-06-09 DIAGNOSIS — M5033 Other cervical disc degeneration, cervicothoracic region: Secondary | ICD-10-CM | POA: Diagnosis not present

## 2021-06-09 DIAGNOSIS — M9901 Segmental and somatic dysfunction of cervical region: Secondary | ICD-10-CM | POA: Diagnosis not present

## 2021-06-09 DIAGNOSIS — M62830 Muscle spasm of back: Secondary | ICD-10-CM | POA: Diagnosis not present

## 2021-06-09 DIAGNOSIS — M9903 Segmental and somatic dysfunction of lumbar region: Secondary | ICD-10-CM | POA: Diagnosis not present

## 2021-06-14 DIAGNOSIS — M25562 Pain in left knee: Secondary | ICD-10-CM | POA: Diagnosis not present

## 2021-06-14 DIAGNOSIS — Z96652 Presence of left artificial knee joint: Secondary | ICD-10-CM | POA: Diagnosis not present

## 2021-06-14 DIAGNOSIS — R2689 Other abnormalities of gait and mobility: Secondary | ICD-10-CM | POA: Diagnosis not present

## 2021-06-28 ENCOUNTER — Other Ambulatory Visit: Payer: Self-pay

## 2021-06-28 ENCOUNTER — Ambulatory Visit
Admission: RE | Admit: 2021-06-28 | Discharge: 2021-06-28 | Disposition: A | Discharge status: Home / Self Care | Payer: PPO | Source: Ambulatory Visit | Attending: Internal Medicine | Admitting: Internal Medicine

## 2021-06-28 DIAGNOSIS — Z1231 Encounter for screening mammogram for malignant neoplasm of breast: Secondary | ICD-10-CM | POA: Insufficient documentation

## 2021-06-29 DIAGNOSIS — M1712 Unilateral primary osteoarthritis, left knee: Secondary | ICD-10-CM | POA: Diagnosis not present

## 2021-07-06 DIAGNOSIS — M5033 Other cervical disc degeneration, cervicothoracic region: Secondary | ICD-10-CM | POA: Diagnosis not present

## 2021-07-06 DIAGNOSIS — M9903 Segmental and somatic dysfunction of lumbar region: Secondary | ICD-10-CM | POA: Diagnosis not present

## 2021-07-06 DIAGNOSIS — M62830 Muscle spasm of back: Secondary | ICD-10-CM | POA: Diagnosis not present

## 2021-07-06 DIAGNOSIS — M9901 Segmental and somatic dysfunction of cervical region: Secondary | ICD-10-CM | POA: Diagnosis not present

## 2021-07-08 DIAGNOSIS — I208 Other forms of angina pectoris: Secondary | ICD-10-CM | POA: Diagnosis not present

## 2021-07-08 DIAGNOSIS — I700 Atherosclerosis of aorta: Secondary | ICD-10-CM | POA: Diagnosis not present

## 2021-07-12 DIAGNOSIS — L280 Lichen simplex chronicus: Secondary | ICD-10-CM | POA: Diagnosis not present

## 2021-08-03 DIAGNOSIS — M5033 Other cervical disc degeneration, cervicothoracic region: Secondary | ICD-10-CM | POA: Diagnosis not present

## 2021-08-03 DIAGNOSIS — M9903 Segmental and somatic dysfunction of lumbar region: Secondary | ICD-10-CM | POA: Diagnosis not present

## 2021-08-03 DIAGNOSIS — M62830 Muscle spasm of back: Secondary | ICD-10-CM | POA: Diagnosis not present

## 2021-08-03 DIAGNOSIS — M9901 Segmental and somatic dysfunction of cervical region: Secondary | ICD-10-CM | POA: Diagnosis not present

## 2021-08-10 DIAGNOSIS — M1712 Unilateral primary osteoarthritis, left knee: Secondary | ICD-10-CM | POA: Diagnosis not present

## 2021-08-31 DIAGNOSIS — M9903 Segmental and somatic dysfunction of lumbar region: Secondary | ICD-10-CM | POA: Diagnosis not present

## 2021-08-31 DIAGNOSIS — M9901 Segmental and somatic dysfunction of cervical region: Secondary | ICD-10-CM | POA: Diagnosis not present

## 2021-08-31 DIAGNOSIS — M62830 Muscle spasm of back: Secondary | ICD-10-CM | POA: Diagnosis not present

## 2021-08-31 DIAGNOSIS — M5033 Other cervical disc degeneration, cervicothoracic region: Secondary | ICD-10-CM | POA: Diagnosis not present

## 2021-09-13 DIAGNOSIS — D2262 Melanocytic nevi of left upper limb, including shoulder: Secondary | ICD-10-CM | POA: Diagnosis not present

## 2021-09-13 DIAGNOSIS — D2272 Melanocytic nevi of left lower limb, including hip: Secondary | ICD-10-CM | POA: Diagnosis not present

## 2021-09-13 DIAGNOSIS — L821 Other seborrheic keratosis: Secondary | ICD-10-CM | POA: Diagnosis not present

## 2021-09-13 DIAGNOSIS — L820 Inflamed seborrheic keratosis: Secondary | ICD-10-CM | POA: Diagnosis not present

## 2021-09-13 DIAGNOSIS — D225 Melanocytic nevi of trunk: Secondary | ICD-10-CM | POA: Diagnosis not present

## 2021-09-13 DIAGNOSIS — D2261 Melanocytic nevi of right upper limb, including shoulder: Secondary | ICD-10-CM | POA: Diagnosis not present

## 2021-09-13 DIAGNOSIS — D485 Neoplasm of uncertain behavior of skin: Secondary | ICD-10-CM | POA: Diagnosis not present

## 2021-09-13 DIAGNOSIS — L280 Lichen simplex chronicus: Secondary | ICD-10-CM | POA: Diagnosis not present

## 2021-09-13 DIAGNOSIS — D2271 Melanocytic nevi of right lower limb, including hip: Secondary | ICD-10-CM | POA: Diagnosis not present

## 2021-09-14 DIAGNOSIS — H353131 Nonexudative age-related macular degeneration, bilateral, early dry stage: Secondary | ICD-10-CM | POA: Diagnosis not present

## 2021-09-28 DIAGNOSIS — M9901 Segmental and somatic dysfunction of cervical region: Secondary | ICD-10-CM | POA: Diagnosis not present

## 2021-09-28 DIAGNOSIS — M62830 Muscle spasm of back: Secondary | ICD-10-CM | POA: Diagnosis not present

## 2021-09-28 DIAGNOSIS — M9903 Segmental and somatic dysfunction of lumbar region: Secondary | ICD-10-CM | POA: Diagnosis not present

## 2021-09-28 DIAGNOSIS — M5033 Other cervical disc degeneration, cervicothoracic region: Secondary | ICD-10-CM | POA: Diagnosis not present

## 2021-10-25 DIAGNOSIS — M5033 Other cervical disc degeneration, cervicothoracic region: Secondary | ICD-10-CM | POA: Diagnosis not present

## 2021-10-25 DIAGNOSIS — M62830 Muscle spasm of back: Secondary | ICD-10-CM | POA: Diagnosis not present

## 2021-10-25 DIAGNOSIS — M9903 Segmental and somatic dysfunction of lumbar region: Secondary | ICD-10-CM | POA: Diagnosis not present

## 2021-10-25 DIAGNOSIS — M9901 Segmental and somatic dysfunction of cervical region: Secondary | ICD-10-CM | POA: Diagnosis not present

## 2021-11-23 DIAGNOSIS — M9903 Segmental and somatic dysfunction of lumbar region: Secondary | ICD-10-CM | POA: Diagnosis not present

## 2021-11-23 DIAGNOSIS — M9901 Segmental and somatic dysfunction of cervical region: Secondary | ICD-10-CM | POA: Diagnosis not present

## 2021-11-23 DIAGNOSIS — M5033 Other cervical disc degeneration, cervicothoracic region: Secondary | ICD-10-CM | POA: Diagnosis not present

## 2021-11-23 DIAGNOSIS — M62830 Muscle spasm of back: Secondary | ICD-10-CM | POA: Diagnosis not present

## 2021-12-08 DIAGNOSIS — M25511 Pain in right shoulder: Secondary | ICD-10-CM | POA: Diagnosis not present

## 2021-12-20 DIAGNOSIS — M62830 Muscle spasm of back: Secondary | ICD-10-CM | POA: Diagnosis not present

## 2021-12-20 DIAGNOSIS — M9903 Segmental and somatic dysfunction of lumbar region: Secondary | ICD-10-CM | POA: Diagnosis not present

## 2021-12-20 DIAGNOSIS — M9901 Segmental and somatic dysfunction of cervical region: Secondary | ICD-10-CM | POA: Diagnosis not present

## 2021-12-20 DIAGNOSIS — M5033 Other cervical disc degeneration, cervicothoracic region: Secondary | ICD-10-CM | POA: Diagnosis not present

## 2022-01-13 ENCOUNTER — Ambulatory Visit: Payer: Self-pay

## 2022-01-13 ENCOUNTER — Telehealth: Payer: Self-pay

## 2022-01-13 NOTE — Patient Instructions (Signed)
Visit Information  Thank you for taking time to visit with me today. Please don't hesitate to contact me if I can be of assistance to you.   Following are the goals we discussed today:   Goals Addressed             This Visit's Progress    COMPLETED: RNCM: Torn muscle in arm       Care Coordination Interventions: Reviewed provider established plan for pain management. The patient is seeing the specialist and she is using ice on her arm and she is also working it good in spin class. She goes to spin class 2 days a week with her sister. She says it will take time for the area to heal but she is confident it will heal well.  Discussed importance of adherence to all scheduled medical appointments. Sees providers as needed and calls for changes  Counseled on the importance of reporting any/all new or changed pain symptoms or management strategies to pain management provider Advised patient to report to care team affect of pain on daily activities. The patient states she is very active. The patient denies any limitations with completing her daily activities.  Discussed use of relaxation techniques and/or diversional activities to assist with pain reduction (distraction, imagery, relaxation, massage, acupressure, TENS, heat, and cold application Reviewed with patient prescribed pharmacological and nonpharmacological pain relief strategies Advised patient to discuss unresolved pain, changes in level or intensity of pain  with provider Screening for signs and symptoms of depression related to chronic disease state  Assessed social determinant of health barriers Review of the care coordination program and the availability of the RNCM. Also reviewed the resources available to the patient of SW and pharmacy support. The patient feels she is getting better and stable. She states she knows she has to work her arm and it will get better over time. Education on calling the [facility] for changes in conditions,  questions, concerns, or educational needs.             Please call the care guide team at [phone] if you need to schedule an appointment.   If you are experiencing a Mental Health or [place] or need someone to talk to, please call the Suicide and Crisis Lifeline: 988 call the [place] National Suicide Prevention Lifeline: 860-628-1051 or TTY: (305) 224-5066 TTY (747) 556-3131) to talk to a trained counselor call 1-800-273-TALK (toll free, 24 hour hotline)  Patient verbalizes understanding of instructions and care plan provided today and agrees to view in [place]. Active MyChart status and patient understanding of how to access instructions and care plan via MyChart confirmed with patient.     No further follow up required: the patient will follow up with RNCM as needed.   Noreene Larsson RN, MSN, [facility]  Mobile: [phone]

## 2022-01-13 NOTE — Patient Outreach (Signed)
  Care Coordination   Initial Visit Note   01/13/2022 Name: Yvonne White MRN: [id number] DOB: [birth year]  [name] is a 76 y.o. year old female who sees Tracie Harrier, MD for primary care. I spoke with  Darron Doom by phone today.  What matters to the patients health and wellness today?  Right arm tear of muscle to heal     Goals Addressed             This Visit's Progress    COMPLETED: RNCM: Torn muscle in arm       Care Coordination Interventions: Reviewed provider established plan for pain management. The patient is seeing the specialist and she is using ice on her arm and she is also working it good in spin class. She goes to spin class 2 days a week with her sister. She says it will take time for the area to heal but she is confident it will heal well.  Discussed importance of adherence to all scheduled medical appointments. Sees providers as needed and calls for changes  Counseled on the importance of reporting any/all new or changed pain symptoms or management strategies to pain management provider Advised patient to report to care team affect of pain on daily activities. The patient states she is very active. The patient denies any limitations with completing her daily activities.  Discussed use of relaxation techniques and/or diversional activities to assist with pain reduction (distraction, imagery, relaxation, massage, acupressure, TENS, heat, and cold application Reviewed with patient prescribed pharmacological and nonpharmacological pain relief strategies Advised patient to discuss unresolved pain, changes in level or intensity of pain  with provider Screening for signs and symptoms of depression related to chronic disease state  Assessed social determinant of health barriers Review of the care coordination program and the availability of the RNCM. Also reviewed the resources available to the patient of SW and pharmacy support. The patient feels she is getting  better and stable. She states she knows she has to work her arm and it will get better over time. Education on calling the [facility] for changes in conditions, questions, concerns, or educational needs.            SDOH assessments and interventions completed:  Yes  SDOH Interventions Today    Flowsheet Row Most Recent Value  SDOH Interventions   Food Insecurity Interventions Intervention Not Indicated  Housing Interventions Intervention Not Indicated  Transportation Interventions Intervention Not Indicated  Utilities Interventions Intervention Not Indicated  Financial Strain Interventions Intervention Not Indicated  Physical Activity Interventions Intervention Not Indicated  [the patient goes to spin class 2 times a week and is very active otherwise]  Stress Interventions Intervention Not Indicated  Social Connections Interventions Intervention Not Indicated        Care Coordination Interventions Activated:  Yes  Care Coordination Interventions:  Yes, provided   Follow up plan: No further intervention required.   Encounter Outcome:  Pt. Visit Completed   Noreene Larsson RN, MSN, [place]  Mobile: [phone]

## 2022-01-13 NOTE — Patient Outreach (Signed)
  Care Coordination   01/13/2022 Name: Yvonne White MRN: [id number] DOB: Sep 13, 1945   Care Coordination Outreach Attempts:  An unsuccessful telephone outreach was attempted today to offer the patient information about available care coordination services as a benefit of their health plan.   Follow Up Plan:  Additional outreach attempts will be made to offer the patient care coordination information and services.   Encounter Outcome:  No Answer  Care Coordination Interventions Activated:  No   Care Coordination Interventions:  No, not indicated    Noreene Larsson RN, MSN, [facility]  Mobile: [phone]

## 2022-01-18 DIAGNOSIS — M9901 Segmental and somatic dysfunction of cervical region: Secondary | ICD-10-CM | POA: Diagnosis not present

## 2022-01-18 DIAGNOSIS — M5033 Other cervical disc degeneration, cervicothoracic region: Secondary | ICD-10-CM | POA: Diagnosis not present

## 2022-01-18 DIAGNOSIS — M62830 Muscle spasm of back: Secondary | ICD-10-CM | POA: Diagnosis not present

## 2022-01-18 DIAGNOSIS — M9903 Segmental and somatic dysfunction of lumbar region: Secondary | ICD-10-CM | POA: Diagnosis not present

## 2022-02-15 DIAGNOSIS — M9901 Segmental and somatic dysfunction of cervical region: Secondary | ICD-10-CM | POA: Diagnosis not present

## 2022-02-15 DIAGNOSIS — M9903 Segmental and somatic dysfunction of lumbar region: Secondary | ICD-10-CM | POA: Diagnosis not present

## 2022-02-15 DIAGNOSIS — M62830 Muscle spasm of back: Secondary | ICD-10-CM | POA: Diagnosis not present

## 2022-02-15 DIAGNOSIS — M5033 Other cervical disc degeneration, cervicothoracic region: Secondary | ICD-10-CM | POA: Diagnosis not present

## 2022-02-22 IMAGING — MG MM DIGITAL SCREENING BILAT W/ TOMO AND CAD
8 series · 9 of 24 positions shown · non-contrast
Comparison: Previous exam(s).

CLINICAL DATA: Screening.

EXAM:
DIGITAL SCREENING BILATERAL MAMMOGRAM WITH TOMOSYNTHESIS AND CAD
TECHNIQUE: Bilateral screening digital craniocaudal and mediolateral oblique
mammograms were obtained. Bilateral screening digital breast
tomosynthesis was performed. The images were evaluated with
computer-aided detection.

[L MLO synth-2D]
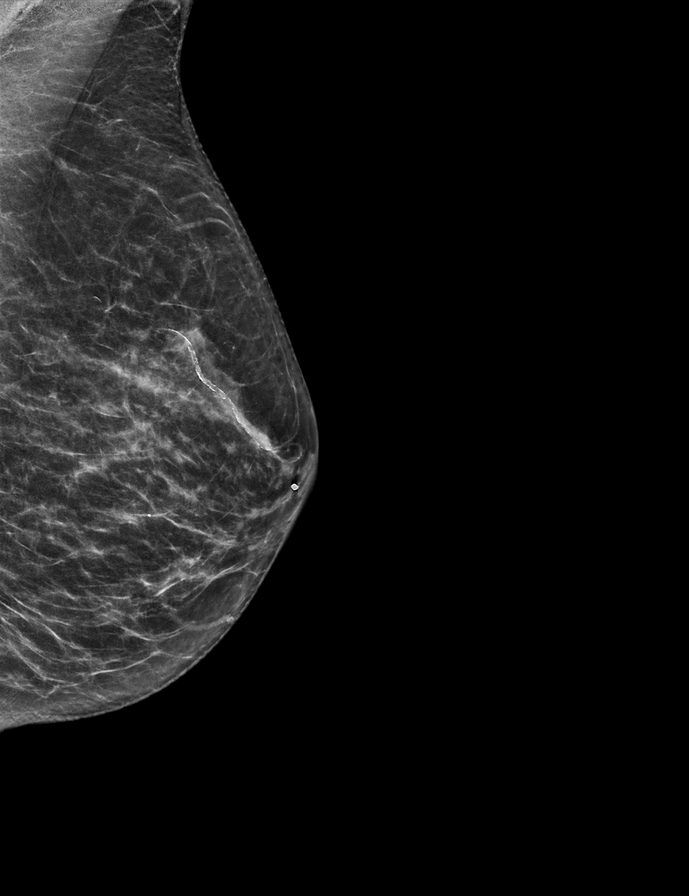

[R MLO synth-2D]
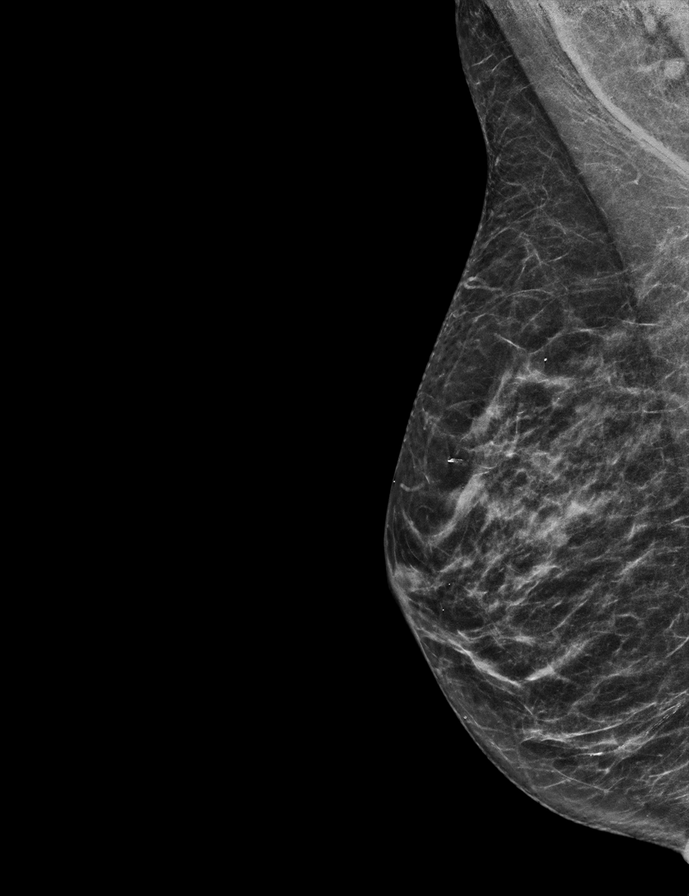

[L CC synth-2D]
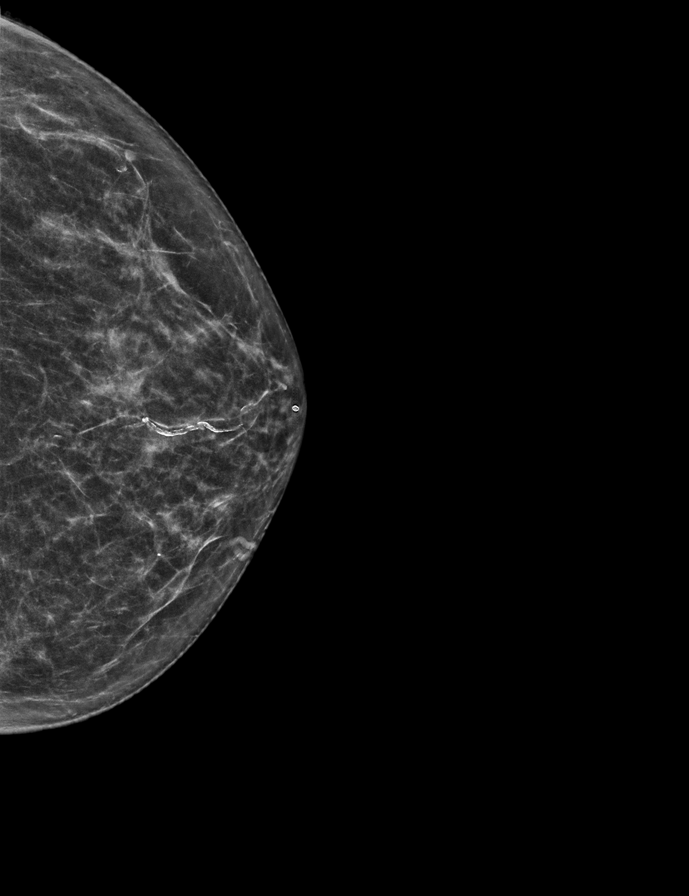

[R CC synth-2D]
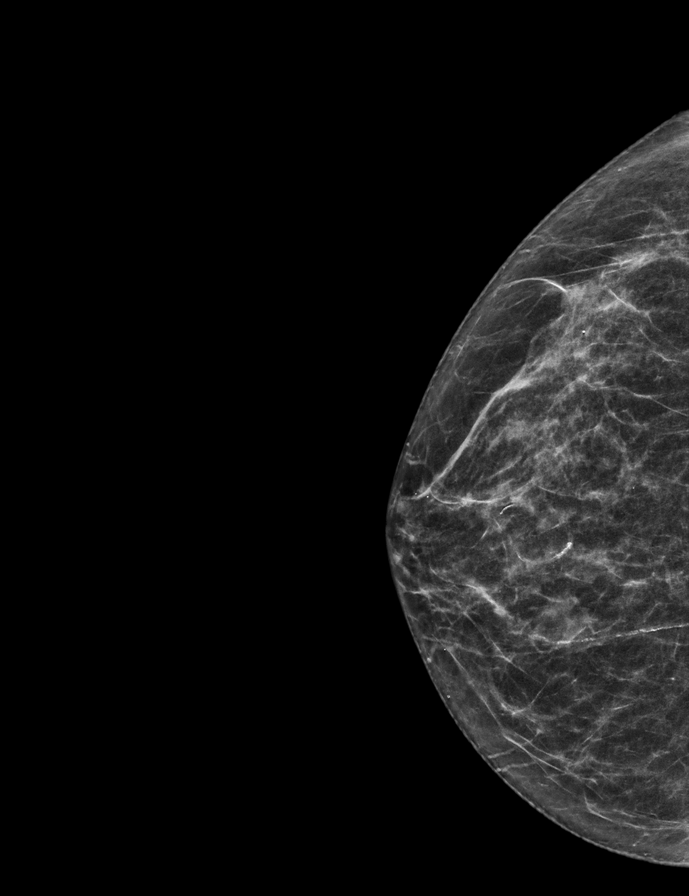

[L MLO tomo · 2 of 57 frames shown]
[frame 19/57]
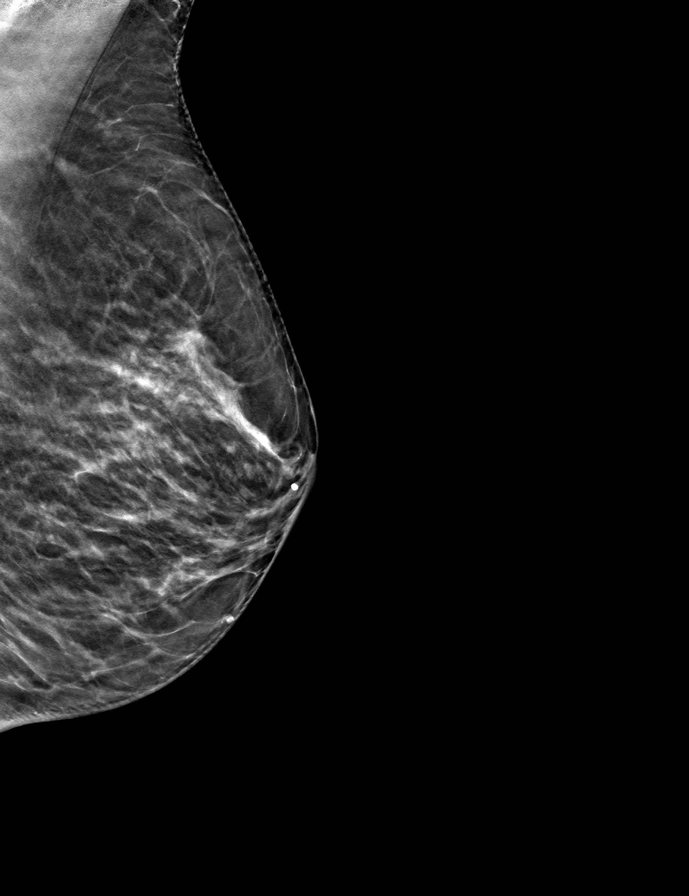
[frame 29/57]
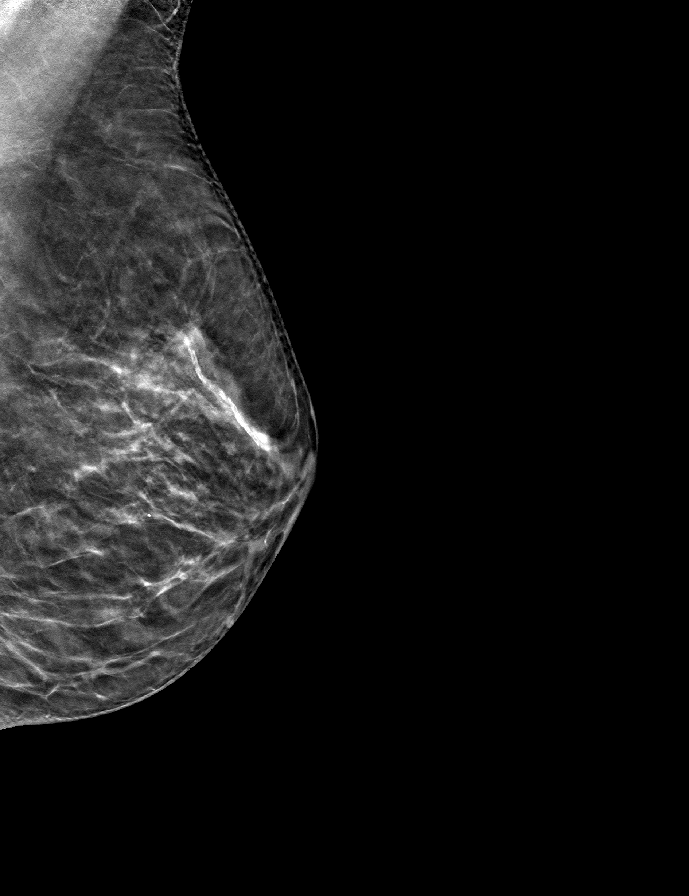

[R CC tomo · tomo slice 28/55.0]
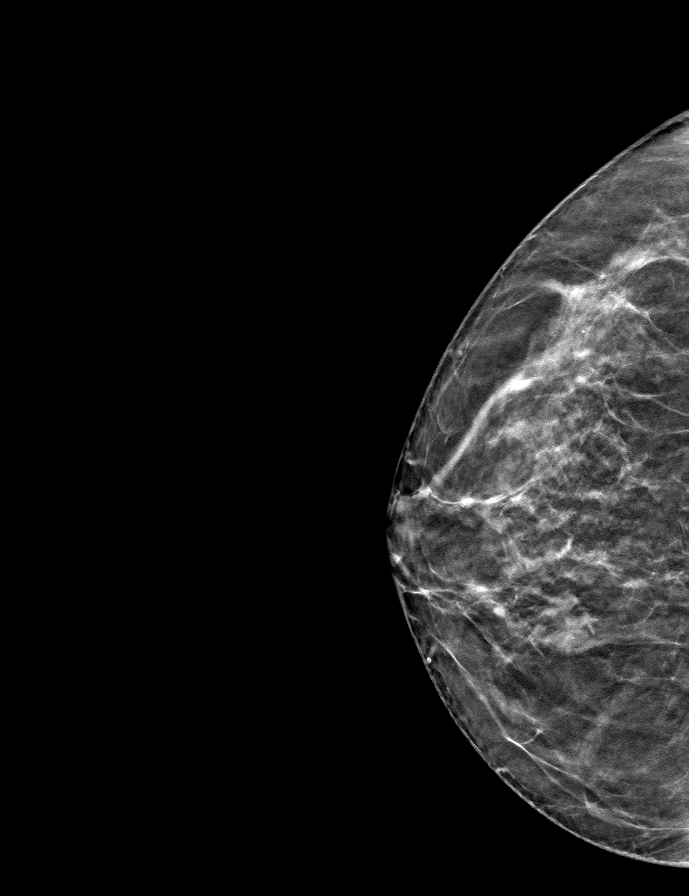

[R MLO tomo · tomo slice 27/54.0]
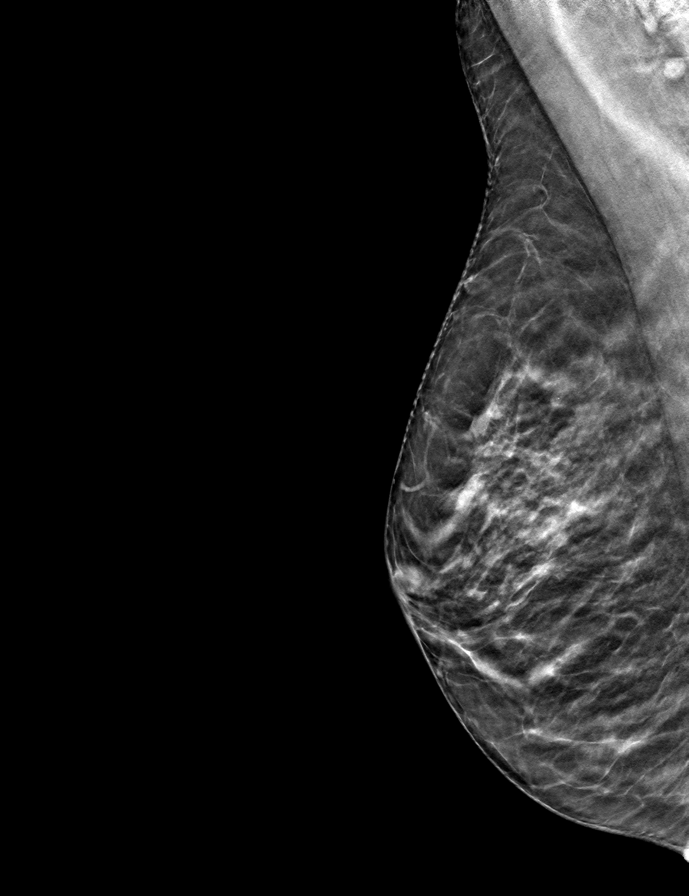

[L CC tomo · tomo slice 29/56.0]
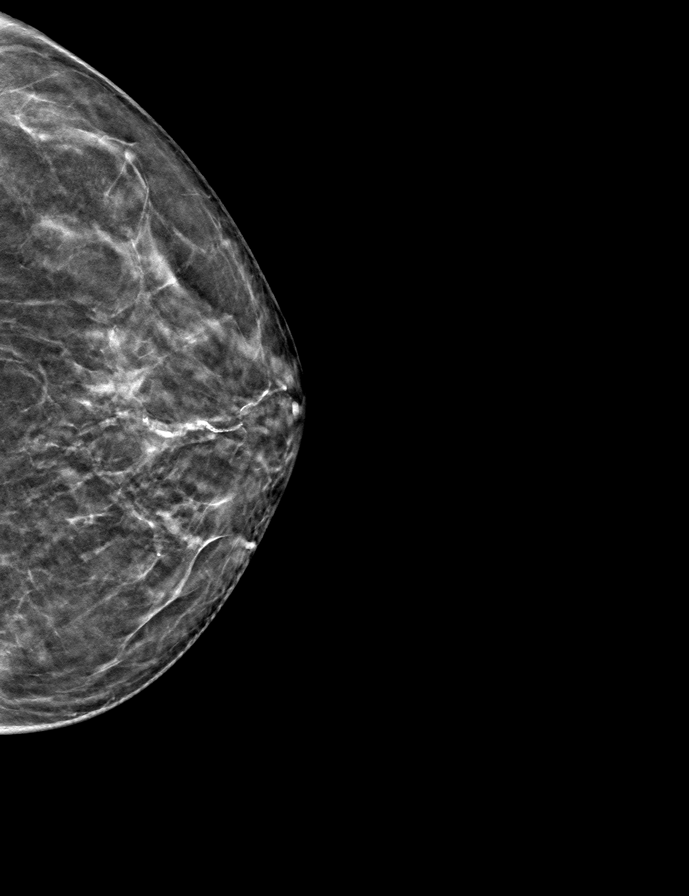

[9 of 24 positions shown; findings below may reference images not displayed]

ACR Breast Density Category b: There are scattered areas of
fibroglandular density.
FINDINGS: There are no findings suspicious for malignancy. The images were
evaluated with computer-aided detection.
IMPRESSION: No mammographic evidence of malignancy. A result letter of this
screening mammogram will be mailed directly to the patient.

RECOMMENDATION:
Screening mammogram in one year. (Code:[id number])

BI-RADS CATEGORY  1: Negative.

## 2022-03-15 DIAGNOSIS — M9903 Segmental and somatic dysfunction of lumbar region: Secondary | ICD-10-CM | POA: Diagnosis not present

## 2022-03-15 DIAGNOSIS — M5033 Other cervical disc degeneration, cervicothoracic region: Secondary | ICD-10-CM | POA: Diagnosis not present

## 2022-03-15 DIAGNOSIS — M62830 Muscle spasm of back: Secondary | ICD-10-CM | POA: Diagnosis not present

## 2022-03-15 DIAGNOSIS — M9901 Segmental and somatic dysfunction of cervical region: Secondary | ICD-10-CM | POA: Diagnosis not present

## 2022-04-12 DIAGNOSIS — M5033 Other cervical disc degeneration, cervicothoracic region: Secondary | ICD-10-CM | POA: Diagnosis not present

## 2022-04-12 DIAGNOSIS — M9901 Segmental and somatic dysfunction of cervical region: Secondary | ICD-10-CM | POA: Diagnosis not present

## 2022-04-12 DIAGNOSIS — M9903 Segmental and somatic dysfunction of lumbar region: Secondary | ICD-10-CM | POA: Diagnosis not present

## 2022-04-12 DIAGNOSIS — M62830 Muscle spasm of back: Secondary | ICD-10-CM | POA: Diagnosis not present

## 2022-05-16 ENCOUNTER — Other Ambulatory Visit: Payer: Self-pay | Admitting: Internal Medicine

## 2022-05-16 DIAGNOSIS — Z1231 Encounter for screening mammogram for malignant neoplasm of breast: Secondary | ICD-10-CM

## 2022-06-30 ENCOUNTER — Ambulatory Visit
Admission: RE | Admit: 2022-06-30 | Discharge: 2022-06-30 | Disposition: A | Discharge status: Home / Self Care | Payer: Medicare HMO | Source: Ambulatory Visit | Attending: Internal Medicine | Admitting: Internal Medicine

## 2022-06-30 DIAGNOSIS — Z1231 Encounter for screening mammogram for malignant neoplasm of breast: Secondary | ICD-10-CM | POA: Insufficient documentation

## 2022-07-07 ENCOUNTER — Other Ambulatory Visit: Payer: Self-pay | Admitting: Internal Medicine

## 2022-07-07 DIAGNOSIS — I700 Atherosclerosis of aorta: Secondary | ICD-10-CM

## 2022-07-08 ENCOUNTER — Ambulatory Visit
Admission: RE | Admit: 2022-07-08 | Discharge: 2022-07-08 | Disposition: A | Discharge status: Home / Self Care | Payer: Medicare HMO | Source: Ambulatory Visit | Attending: Internal Medicine | Admitting: Internal Medicine

## 2022-07-08 DIAGNOSIS — I2510 Atherosclerotic heart disease of native coronary artery without angina pectoris: Secondary | ICD-10-CM

## 2022-07-08 DIAGNOSIS — I700 Atherosclerosis of aorta: Secondary | ICD-10-CM | POA: Insufficient documentation

## 2022-07-08 HISTORY — DX: Atherosclerotic heart disease of native coronary artery without angina pectoris: I25.10

## 2022-08-14 ENCOUNTER — Other Ambulatory Visit: Payer: Self-pay

## 2022-08-14 ENCOUNTER — Emergency Department
Admission: EM | Admit: 2022-08-14 | Discharge: 2022-08-14 | Disposition: A | Discharge status: Home / Self Care | Payer: Medicare HMO | Attending: Emergency Medicine | Admitting: Emergency Medicine

## 2022-08-14 ENCOUNTER — Emergency Department: Payer: Medicare HMO

## 2022-08-14 ENCOUNTER — Encounter: Payer: Self-pay | Admitting: Emergency Medicine

## 2022-08-14 DIAGNOSIS — R1084 Generalized abdominal pain: Secondary | ICD-10-CM | POA: Diagnosis present

## 2022-08-14 LAB — URINALYSIS, ROUTINE W REFLEX MICROSCOPIC
Bilirubin Urine: NEGATIVE
Glucose, UA: NEGATIVE mg/dL
Hgb urine dipstick: NEGATIVE
Ketones, ur: NEGATIVE mg/dL
Leukocytes,Ua: NEGATIVE
Nitrite: NEGATIVE
Protein, ur: NEGATIVE mg/dL
Specific Gravity, Urine: 1.008 (ref 1.005–1.030)
pH: 6 (ref 5.0–8.0)

## 2022-08-14 LAB — CBC
HCT: 39.4 % (ref 36.0–46.0)
Hemoglobin: 12.8 g/dL (ref 12.0–15.0)
MCH: 30.8 pg (ref 26.0–34.0)
MCHC: 32.5 g/dL (ref 30.0–36.0)
MCV: 94.7 fL (ref 80.0–100.0)
Platelets: 267 10*3/uL (ref 150–400)
RBC: 4.16 MIL/uL (ref 3.87–5.11)
RDW: 13.2 % (ref 11.5–15.5)
WBC: 5.3 10*3/uL (ref 4.0–10.5)
nRBC: 0 % (ref 0.0–0.2)

## 2022-08-14 LAB — COMPREHENSIVE METABOLIC PANEL
ALT: 23 U/L (ref 0–44)
AST: 26 U/L (ref 15–41)
Albumin: 4.3 g/dL (ref 3.5–5.0)
Alkaline Phosphatase: 66 U/L (ref 38–126)
Anion gap: 9 (ref 5–15)
BUN: 17 mg/dL (ref 8–23)
CO2: 25 mmol/L (ref 22–32)
Calcium: 9.3 mg/dL (ref 8.9–10.3)
Chloride: 104 mmol/L (ref 98–111)
Creatinine, Ser: 0.83 mg/dL (ref 0.44–1.00)
GFR, Estimated: >60 mL/min (ref >60)
Glucose, Bld: 101 mg/dL — ABNORMAL HIGH (ref 70–99)
Potassium: 4.3 mmol/L (ref 3.5–5.1)
Sodium: 138 mmol/L (ref 135–145)
Total Bilirubin: 0.8 mg/dL (ref 0.3–1.2)
Total Protein: 7.2 g/dL (ref 6.5–8.1)

## 2022-08-14 LAB — LIPASE, BLOOD: Lipase: 43 U/L (ref 11–51)

## 2022-08-14 MED ORDER — METOCLOPRAMIDE HCL 10 MG PO TABS: 10.0000 mg | ORAL_TABLET | Freq: Three times a day (TID) | ORAL | 0 refills | Status: AC

## 2022-08-14 MED ORDER — HYOSCYAMINE SULFATE 0.125 MG PO TBDP
0.2500 mg | ORAL_TABLET | Freq: Once | ORAL | Status: AC
  Administered 2022-08-14: 0.25 mg via ORAL
  Filled 2022-08-14: qty 2

## 2022-08-14 MED ORDER — ONDANSETRON HCL 4 MG/2ML IJ SOLN
4.0000 mg | Freq: Once | INTRAMUSCULAR | Status: AC
  Administered 2022-08-14: 4 mg via INTRAVENOUS
  Filled 2022-08-14: qty 2

## 2022-08-14 MED ORDER — PROCHLORPERAZINE EDISYLATE 10 MG/2ML IJ SOLN
10.0000 mg | INTRAMUSCULAR | Status: AC
  Administered 2022-08-14: 10 mg via INTRAVENOUS
  Filled 2022-08-14: qty 2

## 2022-08-14 MED ORDER — FENTANYL CITRATE PF 50 MCG/ML IJ SOSY: 50.0000 ug | PREFILLED_SYRINGE | Freq: Once | INTRAMUSCULAR | Status: DC

## 2022-08-14 MED ORDER — MORPHINE SULFATE (PF) 4 MG/ML IV SOLN
4.0000 mg | Freq: Once | INTRAVENOUS | Status: AC
  Administered 2022-08-14: 4 mg via INTRAVENOUS
  Filled 2022-08-14: qty 1

## 2022-08-14 MED ORDER — IOHEXOL 300 MG/ML  SOLN
100.0000 mL | Freq: Once | INTRAMUSCULAR | Status: AC | PRN
  Administered 2022-08-14: 100 mL via INTRAVENOUS

## 2022-08-14 MED ORDER — PROCHLORPERAZINE EDISYLATE 10 MG/2ML IJ SOLN: 10.0000 mg | INTRAMUSCULAR | Status: DC

## 2022-08-14 NOTE — ED Provider Notes (Signed)
-----------------------------------------   7:36 PM on 08/14/2022 -----------------------------------------  Blood pressure (!) 152/87, pulse 74, temperature 97.8 F (36.6 C), temperature source Oral, resp. rate 18, height  (1.626 m), weight 57.6 kg, SpO2 97 %.  Assuming care from Dr. Larinda Buttery.  In short, Yvonne White is a 77 y.o. female with a chief complaint of Abdominal Pain .  Refer to the original H&P for additional details.  The current plan of care is to follow up on results of abdominal CT with plan to discharge unless abnormal.  ----------------------------------------- 8:07 PM on 08/14/2022 -----------------------------------------  CT of the abdomen and pelvis is negative for any acute concerns.  Results discussed with the patient who states that she continues to have the abdominal pain.  We discussed the likelihood that it is related to her hiatal hernia.  Patient states that is never hurt this way before.  She is still having sharp pain and denies any nausea or vomiting.  Plan will be to try Levsin and if it is effective, prescription will be sent to the pharmacy.  ----------------------------------------- 8:54 PM on 08/14/2022 -----------------------------------------  No relief with Levsin.   ----------------------------------------- 9:52 PM on 08/14/2022 -----------------------------------------  Patient reports some pain relief after Compazine and is requesting discharge.  ER return precautions discussed.  She was strongly encouraged to call and schedule follow-up appointment with her primary care provider this week.   Chinita Pester, FNP 08/14/22 2152    Chesley Noon, MD 08/16/22 1535

## 2022-08-14 NOTE — ED Triage Notes (Signed)
Pt via POV from home. Pt c/o upper abd pain that started last night. Denies any NVD. States that she has a hx of issues with her gallbladder. Denies urinary symptoms. Denies fever. States the pain has been consistent all across her upper abd. Pt is A&Ox4 and NAD

## 2022-08-14 NOTE — Discharge Instructions (Addendum)
Please schedule follow-up appointment with with your primary care provider for this week if possible.  Return to the emergency department if your symptoms change or worsen and you are unable to schedule an appointment.

## 2022-08-14 NOTE — ED Provider Notes (Signed)
[facility] Provider Note    Event Date/Time   First MD Initiated Contact with Patient 08/14/22 1759     (approximate)   History   Chief Complaint Abdominal Pain   HPI  Yvonne White is a 77 y.o. female with past medical history of hiatal hernia, diverticulosis, and GERD who presents to the ED complaining of abdominal pain.  Patient reports that since last night she has been dealing with constant pain primarily in her epigastrium but present across her entire abdomen.  She describes the pain as sharp and severe, not exacerbated or alleviated by anything in particular.  She has not had any nausea or vomiting and denies any changes in her bowel movements.  She has not had any fevers, dysuria, hematuria, or flank pain.  She reports 1 episode of similar pain in the past which was attributed to her gallbladder, however she has never had her gallbladder removed.     Physical Exam   Triage Vital Signs: ED Triage Vitals  Enc Vitals Group     BP 08/14/22 1748 (!) 152/87     Pulse Rate 08/14/22 1748 74     Resp 08/14/22 1748 18     Temp 08/14/22 1748 97.8 F (36.6 C)     Temp Source 08/14/22 1748 Oral     SpO2 08/14/22 1748 97 %     Weight 08/14/22 1747 127 lb (57.6 kg)     Height 08/14/22 1747  (1.626 m)     Head Circumference --      Peak Flow --      Pain Score 08/14/22 1747 8     Pain Loc --      Pain Edu? --      Excl. in GC? --     Most recent vital signs: Vitals:   08/14/22 1748  BP: (!) 152/87  Pulse: 74  Resp: 18  Temp: 97.8 F (36.6 C)  SpO2: 97%    Constitutional: Alert and oriented. Eyes: Conjunctivae are normal. Head: Atraumatic. Nose: No congestion/rhinnorhea. Mouth/Throat: Mucous membranes are moist.  Cardiovascular: Normal rate, regular rhythm. Grossly normal heart sounds.  2+ radial pulses bilaterally. Respiratory: Normal respiratory effort.  No retractions. Lungs CTAB. Gastrointestinal: Soft and diffusely tender to  palpation with no rebound or guarding. No distention. Musculoskeletal: No lower extremity tenderness nor edema.  Neurologic:  Normal speech and language. No gross focal neurologic deficits are appreciated.    ED Results / Procedures / Treatments   Labs (all labs ordered are listed, but only abnormal results are displayed) Labs Reviewed  COMPREHENSIVE METABOLIC PANEL - Abnormal; Notable for the following components:      Result Value   Glucose, Bld 101 (*)    All other components within normal limits  URINALYSIS, ROUTINE W REFLEX MICROSCOPIC - Abnormal; Notable for the following components:   Color, Urine STRAW (*)    APPearance CLEAR (*)    All other components within normal limits  LIPASE, BLOOD  CBC     EKG  ED ECG REPORT I, Chesley Noon, the attending physician, personally viewed and interpreted this ECG.   Date: 08/14/2022  EKG Time: 17:56  Rate: 63  Rhythm: normal sinus rhythm  Axis: Normal  Intervals:none  ST&T Change: None  PROCEDURES:  Critical Care performed: No  Procedures   MEDICATIONS ORDERED IN ED: Medications  morphine (PF) 4 MG/ML injection 4 mg (4 mg Intravenous Given 08/14/22 1829)  ondansetron (ZOFRAN) injection 4 mg (4 mg  Intravenous Given 08/14/22 1826)  iohexol (OMNIPAQUE) 300 MG/ML solution 100 mL (100 mLs Intravenous Contrast Given 08/14/22 1901)     IMPRESSION / MDM / ASSESSMENT AND PLAN / ED COURSE  I reviewed the triage vital signs and the nursing notes.                              77 y.o. female with past medical history of GERD, diverticulosis, and hiatal hernia who presents to the ED complaining of constant pain primarily in her epigastrium but present across abdomen diffusely since last night.  Patient's presentation is most consistent with acute presentation with potential threat to life or bodily function.  Differential diagnosis includes, but is not limited to, pancreatitis, hepatitis, biliary colic, cholecystitis, bowel  obstruction, gastritis, GERD, ACS, kidney stone, UTI.  Patient nontoxic appearing and in no acute distress, vital signs are unremarkable.  Abdomen is soft and diffusely tender to palpation on exam and we will further assess with CT imaging.  Labs and urinalysis are pending at this time, will treat symptomatically with IV morphine and Zofran, reassess following labs and imaging.  Low suspicion for cardiac etiology, EKG shows no evidence of arrhythmia or ischemia.  Labs are reassuring with no significant anemia, leukocytosis, tract abnormality, or AKI.  Urinalysis shows no signs of infection.  Patient turned over to oncoming divider pending CT results and reassessment.      FINAL CLINICAL IMPRESSION(S) / ED DIAGNOSES   Final diagnoses:  Generalized abdominal pain     Rx / DC Orders   ED Discharge Orders     None        Note:  This document was prepared using Dragon voice recognition software and may include unintentional dictation errors.   Chesley Noon, MD 08/14/22 1910

## 2022-08-18 ENCOUNTER — Other Ambulatory Visit: Payer: Self-pay | Admitting: Internal Medicine

## 2022-08-18 DIAGNOSIS — R1010 Upper abdominal pain, unspecified: Secondary | ICD-10-CM

## 2022-08-23 ENCOUNTER — Ambulatory Visit
Admission: RE | Admit: 2022-08-23 | Discharge: 2022-08-23 | Disposition: A | Discharge status: Home / Self Care | Payer: Medicare HMO | Source: Ambulatory Visit | Attending: Internal Medicine | Admitting: Internal Medicine

## 2022-08-23 DIAGNOSIS — R1010 Upper abdominal pain, unspecified: Secondary | ICD-10-CM

## 2022-09-02 ENCOUNTER — Ambulatory Visit: Payer: Self-pay | Admitting: General Surgery

## 2022-09-02 NOTE — H&P (Signed)
PATIENT PROFILE: Yvonne White is a 77 y.o. female who presents to the Clinic for consultation at the request of Dr. Marcello Fennel for evaluation of gallbladder polyp.  PCP:  Alan Mulder, MD  HISTORY OF PRESENT ILLNESS: Yvonne White reports she has been having intermittent right-sided abdominal pain.  Last attack was 3 weeks ago.  She went to the ED for evaluation of right-sided abdominal pain.  CT scan of the abdomen and pelvis negative for acute finding to explain the right-sided abdominal pain.  Patient with chronic hiatal hernia.  I personally Maletta the images.  She had an abdominal ultrasound that showed a 10 mm gallbladder polyp, no cholelithiasis, no gallbladder wall thickening.  I personally evaluated the images.  Patient cannot identify any alleviating or aggravating factors.  No pain radiation.   PROBLEM LIST: Problem List  Date Reviewed: 08/17/2022          Noted   Hernia, hiatal 04/08/2020   Aortic atherosclerosis (CMS-HCC) 04/08/2020   Scoliosis of lumbar spine 04/08/2020   Generalized osteoarthritis of multiple sites 09/05/2017    GENERAL REVIEW OF SYSTEMS:   General ROS: negative for - chills, fatigue, fever, weight gain or weight loss Allergy and Immunology ROS: negative for - hives  Hematological and Lymphatic ROS: negative for - bleeding problems or bruising, negative for palpable nodes Endocrine ROS: negative for - heat or cold intolerance, hair changes Respiratory ROS: negative for - cough, shortness of breath or wheezing Cardiovascular ROS: no chest pain or palpitations GI ROS: negative for nausea, vomiting, diarrhea, constipation.  Positive for abdominal pain Musculoskeletal ROS: negative for - joint swelling or muscle pain Neurological ROS: negative for - confusion, syncope Dermatological ROS: negative for pruritus and rash Psychiatric: negative for anxiety, depression, difficulty sleeping and memory loss  MEDICATIONS: Current Outpatient Medications   Medication Sig Dispense Refill   acetaminophen (TYLENOL) 500 MG tablet Take 1,000 mg by mouth 2 (two) times daily     calcium carbonate-vitamin D3 (CALTRATE 600+D) 600 mg-10 mcg (400 unit) tablet calcium carbonate 600 mg-vitamin D3 10 mcg (400 unit) tablet   1  by oral route.     cholecalciferol, vitD3,/vit K2 (VITAMIN D3-VITAMIN K2 ORAL) Take 1 tablet by mouth once daily     diclofenac (VOLTAREN) 75 MG EC tablet Take 1 tablet (75 mg total) by mouth 2 (two) times daily with meals (Patient taking differently: Take 75 mg by mouth once daily) 180 tablet 1   metoclopramide (REGLAN) 10 MG tablet Take 10 mg by mouth 3 (three) times daily     pantoprazole (PROTONIX) 20 MG DR tablet Take 1 tablet (20 mg total) by mouth once daily 90 tablet 3   vit C/E/Zn/coppr/lutein/zeaxan (PRESERVISION AREDS-2 ORAL) Take 2 capsules by mouth once daily     vit C/zinc citrate/elderberry (ELDERBERRY IMMUNE HEALTH ORAL) Take 1 tablet by mouth once daily     No current facility-administered medications for this visit.    ALLERGIES: Ceftin [cefuroxime axetil] and Penicillin  PAST MEDICAL HISTORY: Past Medical History:  Diagnosis Date   Cataract cortical, senile    OA (osteoarthritis)     PAST SURGICAL HISTORY: Past Surgical History:  Procedure Laterality Date   JOINT REPLACEMENT  2013   Hand   right foot surgery  2017   REPLACEMENT TOTAL KNEE Left 2023   HALLUX VALGUS CORRECTION     HYSTERECTOMY     Left proximal thumb surgery     TONSILLECTOMY  Childhood     FAMILY HISTORY: Family  History  Problem Relation Name Age of Onset   Osteoporosis (Thinning of bones) Mother Garlan Fair    Colon polyps Mother Garlan Fair    Hip fracture Mother Garlan Fair    Osteoarthritis Mother Garlan Fair    Diverticulitis Father Lamont Snowball    Hyperlipidemia (Elevated cholesterol) Father Lamont Snowball    Prostate cancer Father Lamont Snowball    Thyroid disease Father Lamont Snowball    Breast cancer Maternal  Aunt Lorenza [place]    Asthma Sister Desiree Hane    Obesity Sister Desiree Hane    Colon cancer Maternal Aunt Orion Crook    Skin cancer Maternal Grandmother Dorann Lodge    Thyroid disease Sister Babs Drumwright      SOCIAL HISTORY: Social History   Socioeconomic History   Marital status: Married    Spouse name: Anamaris Sarantos  Occupational History   Occupation: Retired  Tobacco Use   Smoking status: Former    Current packs/day: 0.00    Types: Cigarettes    Quit date: 04/25/1968    Years since quitting: 54.3   Smokeless tobacco: Never  Vaping Use   Vaping status: Never Used  Substance and Sexual Activity   Alcohol use: Yes    Alcohol/week: 1.0 standard drink of alcohol    Types: 1 Glasses of wine per week   Drug use: Never   Sexual activity: Yes    Partners: Male   Social Determinants of Health   Financial Resource Strain: Low Risk  (08/17/2022)   Overall Financial Resource Strain (CARDIA)    Difficulty of Paying Living Expenses: Not hard at all  Food Insecurity: No Food Insecurity (08/17/2022)   Hunger Vital Sign    Worried About Running Out of Food in the Last Year: Never true    Ran Out of Food in the Last Year: Never true  Transportation Needs: No Transportation Needs (08/17/2022)   PRAPARE - Administrator, Civil Service (Medical): No    Lack of Transportation (Non-Medical): No    PHYSICAL EXAM: Vitals:   09/02/22 0930  BP: 125/73  Pulse: 62   Body mass index is 22.49 kg/m. Weight: 59.4 kg (131 lb)   GENERAL: Alert, active, oriented x3  HEENT: Pupils equal reactive to light. Extraocular movements are intact. Sclera clear. Palpebral conjunctiva normal red color.Pharynx clear.  NECK: Supple with no palpable mass and no adenopathy.  LUNGS: Sound clear with no rales rhonchi or wheezes.  HEART: Regular rhythm S1 and S2 without murmur.  ABDOMEN: Soft and depressible, nontender with no palpable mass, no hepatomegaly.   EXTREMITIES:  Well-developed well-nourished symmetrical with no dependent edema.  NEUROLOGICAL: Awake alert oriented, facial expression symmetrical, moving all extremities.  REVIEW OF DATA: I have reviewed the following data today: Office Visit on 08/17/2022  Component Date Value   H pylori Breath Test - L* 08/17/2022 Negative    Hemoccult ICT 08/22/2022 Positive (!)      ASSESSMENT: Ms. Augspurger is a 77 y.o. female presenting for consultation for gallbladder polyp.    Patient was oriented about the diagnosis of 10 mm gallbladder polyp. Also oriented about what is the gallbladder, its anatomy and function and the implications of having a 10 mm gallbladder polyp.  Once gallbladder polyp reaches 10 mm is considered to have the potential to convert to cancer.  Recommendation is to do cholecystectomy.  I was very clear with the patient that I cannot guarantee that her right-sided abdominal pain is due to the gallbladder  polyp.  Patient was oriented that a low percentage of patient will continue to have similar pain symptoms even after the gallbladder is removed. Surgical technique (open vs laparoscopic) was discussed. It was also discussed the goals of the surgery (decrease the pain episodes and avoid the risk of cholecystitis) and the risk of surgery including: bleeding, infection, common bile duct injury, stone retention, injury to other organs such as bowel, liver, stomach, other complications such as hernia, bowel obstruction among others. Also discussed with patient about anesthesia and its complications such as: reaction to medications, pneumonia, heart complications, death, among others.   Gallbladder polyp [K82.4]  PLAN: 1.  Robotic assisted laparoscopic cholecystectomy (54098) 2.  Do not take aspirin 5 days before the procedure 3.  Contact us if has any question or concern.   Patient verbalized understanding, all questions were answered, and were agreeable with the plan outlined above.

## 2022-09-02 NOTE — H&P (View-Only) (Signed)
PATIENT PROFILE: Yvonne White is a 77 y.o. female who presents to the Clinic for consultation at the request of Dr. Hande for evaluation of gallbladder polyp.  PCP:  Hande, Vishwanath Handattur, MD  HISTORY OF PRESENT ILLNESS: Yvonne White reports she has been having intermittent right-sided abdominal pain.  Last attack was 3 weeks ago.  She went to the ED for evaluation of right-sided abdominal pain.  CT scan of the abdomen and pelvis negative for acute finding to explain the right-sided abdominal pain.  Patient with chronic hiatal hernia.  I personally Mal[place] the images.  She had an abdominal ultrasound that showed a 10 mm gallbladder polyp, no cholelithiasis, no gallbladder wall thickening.  I personally evaluated the images.  Patient cannot identify any alleviating or aggravating factors.  No pain radiation.   PROBLEM LIST: Problem List  Date Reviewed: 08/17/2022          Noted   Hernia, hiatal 04/08/2020   Aortic atherosclerosis (CMS-HCC) 04/08/2020   Scoliosis of lumbar spine 04/08/2020   Generalized osteoarthritis of multiple sites 09/05/2017    GENERAL REVIEW OF SYSTEMS:   General ROS: negative for - chills, fatigue, fever, weight gain or weight loss Allergy and Immunology ROS: negative for - hives  Hematological and Lymphatic ROS: negative for - bleeding problems or bruising, negative for palpable nodes Endocrine ROS: negative for - heat or cold intolerance, hair changes Respiratory ROS: negative for - cough, shortness of breath or wheezing Cardiovascular ROS: no chest pain or palpitations GI ROS: negative for nausea, vomiting, diarrhea, constipation.  Positive for abdominal pain Musculoskeletal ROS: negative for - joint swelling or muscle pain Neurological ROS: negative for - confusion, syncope Dermatological ROS: negative for pruritus and rash Psychiatric: negative for anxiety, depression, difficulty sleeping and memory loss  MEDICATIONS: Current Outpatient Medications   Medication Sig Dispense Refill   acetaminophen (TYLENOL) 500 MG tablet Take 1,000 mg by mouth 2 (two) times daily     calcium carbonate-vitamin D3 (CALTRATE 600+D) 600 mg-10 mcg (400 unit) tablet calcium carbonate 600 mg-vitamin D3 10 mcg (400 unit) tablet   1  by oral route.     cholecalciferol, vitD3,/vit K2 (VITAMIN D3-VITAMIN K2 ORAL) Take 1 tablet by mouth once daily     diclofenac (VOLTAREN) 75 MG EC tablet Take 1 tablet (75 mg total) by mouth 2 (two) times daily with meals (Patient taking differently: Take 75 mg by mouth once daily) 180 tablet 1   metoclopramide (REGLAN) 10 MG tablet Take 10 mg by mouth 3 (three) times daily     pantoprazole (PROTONIX) 20 MG DR tablet Take 1 tablet (20 mg total) by mouth once daily 90 tablet 3   vit C/E/Zn/coppr/lutein/zeaxan (PRESERVISION AREDS-2 ORAL) Take 2 capsules by mouth once daily     vit C/zinc citrate/elderberry (ELDERBERRY IMMUNE HEALTH ORAL) Take 1 tablet by mouth once daily     No current facility-administered medications for this visit.    ALLERGIES: Ceftin [cefuroxime axetil] and Penicillin  PAST MEDICAL HISTORY: Past Medical History:  Diagnosis Date   Cataract cortical, senile    OA (osteoarthritis)     PAST SURGICAL HISTORY: Past Surgical History:  Procedure Laterality Date   JOINT REPLACEMENT  2013   Hand   right foot surgery  2017   REPLACEMENT TOTAL KNEE Left 2023   HALLUX VALGUS CORRECTION     HYSTERECTOMY     Left proximal thumb surgery     TONSILLECTOMY  Childhood     FAMILY HISTORY: Family   History  Problem Relation Name Age of Onset   Osteoporosis (Thinning of bones) Mother Carlene Hodge    Colon polyps Mother Carlene Hodge    Hip fracture Mother Carlene Hodge    Osteoarthritis Mother Carlene Hodge    Diverticulitis Father Charlie Hodge    Hyperlipidemia (Elevated cholesterol) Father Charlie Hodge    Prostate cancer Father Charlie Hodge    Thyroid disease Father Charlie Hodge    Breast cancer Maternal  Aunt Eleanor Foust    Asthma Sister Bonnie Allen    Obesity Sister Bonnie Allen    Colon cancer Maternal Aunt Mary Perella    Skin cancer Maternal Grandmother Lennie Loy    Thyroid disease Sister Babs Drumwright      SOCIAL HISTORY: Social History   Socioeconomic History   Marital status: Married    Spouse name: Eric Bress  Occupational History   Occupation: Retired  Tobacco Use   Smoking status: Former    Current packs/day: 0.00    Types: Cigarettes    Quit date: 04/25/1968    Years since quitting: 54.3   Smokeless tobacco: Never  Vaping Use   Vaping status: Never Used  Substance and Sexual Activity   Alcohol use: Yes    Alcohol/week: 1.0 standard drink of alcohol    Types: 1 Glasses of wine per week   Drug use: Never   Sexual activity: Yes    Partners: Male   Social Determinants of Health   Financial Resource Strain: Low Risk  (08/17/2022)   Overall Financial Resource Strain (CARDIA)    Difficulty of Paying Living Expenses: Not hard at all  Food Insecurity: No Food Insecurity (08/17/2022)   Hunger Vital Sign    Worried About Running Out of Food in the Last Year: Never true    Ran Out of Food in the Last Year: Never true  Transportation Needs: No Transportation Needs (08/17/2022)   PRAPARE - Transportation    Lack of Transportation (Medical): No    Lack of Transportation (Non-Medical): No    PHYSICAL EXAM: Vitals:   09/02/22 0930  BP: 125/73  Pulse: 62   Body mass index is 22.49 kg/m. Weight: 59.4 kg (131 lb)   GENERAL: Alert, active, oriented x3  HEENT: Pupils equal reactive to light. Extraocular movements are intact. Sclera clear. Palpebral conjunctiva normal red color.Pharynx clear.  NECK: Supple with no palpable mass and no adenopathy.  LUNGS: Sound clear with no rales rhonchi or wheezes.  HEART: Regular rhythm S1 and S2 without murmur.  ABDOMEN: Soft and depressible, nontender with no palpable mass, no hepatomegaly.   EXTREMITIES:  Well-developed well-nourished symmetrical with no dependent edema.  NEUROLOGICAL: Awake alert oriented, facial expression symmetrical, moving all extremities.  REVIEW OF DATA: I have reviewed the following data today: Office Visit on 08/17/2022  Component Date Value   H pylori Breath Test - L* 08/17/2022 Negative    Hemoccult ICT 08/22/2022 Positive (!)      ASSESSMENT: Ms. Neria is a 77 y.o. female presenting for consultation for gallbladder polyp.    Patient was oriented about the diagnosis of 10 mm gallbladder polyp. Also oriented about what is the gallbladder, its anatomy and function and the implications of having a 10 mm gallbladder polyp.  Once gallbladder polyp reaches 10 mm is considered to have the potential to convert to cancer.  Recommendation is to do cholecystectomy.  I was very clear with the patient that I cannot guarantee that her right-sided abdominal pain is due to the gallbladder   polyp.  Patient was oriented that a low percentage of patient will continue to have similar pain symptoms even after the gallbladder is removed. Surgical technique (open vs laparoscopic) was discussed. It was also discussed the goals of the surgery (decrease the pain episodes and avoid the risk of cholecystitis) and the risk of surgery including: bleeding, infection, common bile duct injury, stone retention, injury to other organs such as bowel, liver, stomach, other complications such as hernia, bowel obstruction among others. Also discussed with patient about anesthesia and its complications such as: reaction to medications, pneumonia, heart complications, death, among others.   Gallbladder polyp [K82.4]  PLAN: 1.  Robotic assisted laparoscopic cholecystectomy (47562) 2.  Do not take aspirin 5 days before the procedure 3.  Contact us if has any question or concern.   Patient verbalized understanding, all questions were answered, and were agreeable with the plan outlined above.   

## 2022-09-07 ENCOUNTER — Other Ambulatory Visit: Payer: Self-pay

## 2022-09-07 ENCOUNTER — Encounter
Admission: RE | Admit: 2022-09-07 | Discharge: 2022-09-07 | Disposition: A | Discharge status: Home / Self Care | Payer: Medicare HMO | Source: Ambulatory Visit | Attending: General Surgery | Admitting: General Surgery

## 2022-09-07 HISTORY — DX: Angina pectoris, unspecified: I20.9

## 2022-09-07 NOTE — Patient Instructions (Addendum)
Your procedure is scheduled on: 09/12/22 - Monday Report to the Registration Desk on the 1st floor of the [facility]. To find out your arrival time, please call [phone] between 1PM - 3PM on: 09/09/22 - Friday If your arrival time is 6:00 am, do not arrive before that time as the [facility] entrance doors do not open until 6:00 am.  REMEMBER: Instructions that are not followed completely may result in serious medical risk, up to and including death; or upon the discretion of your surgeon and anesthesiologist your surgery may need to be rescheduled.  Do not eat food or drinking any liquids after midnight the night before surgery.  No gum chewing or hard candies.  One week prior to surgery: Stop taking beginning 09/07/22, diclofenac (VOLTAREN) and Anti-inflammatories (NSAIDS) such as Advil, Aleve, Ibuprofen, Motrin, Naproxen, Naprosyn and Aspirin based products such as Excedrin, Goody's Powder, BC Powder.  Stop beginning 09/07/22, ANY OVER THE COUNTER supplements until after surgery : CALCIUM-VITAMIN D3 , Vitamin D-Vitamin K ,Zinc ,Multiple Vitamins-Minerals ,(PRESERVISION AREDS ELDERBERRY IMMUNE COMPLEX .,  You may however, continue to take Tylenol if needed for pain up until the day of surgery.  TAKE ONLY THESE MEDICATIONS THE MORNING OF SURGERY WITH A SIP OF WATER:  pantoprazole (PROTONIX) - (take one the night before and one on the morning of surgery - helps to prevent nausea after surgery.)   No Alcohol for 24 hours before or after surgery.  No Smoking including e-cigarettes for 24 hours before surgery.  No chewable tobacco products for at least 6 hours before surgery.  No nicotine patches on the day of surgery.  Do not use any "recreational" drugs for at least a week (preferably 2 weeks) before your surgery.  Please be advised that the combination of cocaine and anesthesia may have negative outcomes, up to and including death. If you test positive for cocaine, your  surgery will be cancelled.  On the morning of surgery brush your teeth with toothpaste and water, you may rinse your mouth with mouthwash if you wish. Do not swallow any toothpaste or mouthwash.  Use CHG Soap or wipes as directed on instruction sheet.  Do not wear jewelry, make-up, hairpins, clips or nail polish.  Do not wear lotions, powders, or perfumes.   Do not shave body hair from the neck down 48 hours before surgery.  Contact lenses, hearing aids and dentures may not be worn into surgery.  Do not bring valuables to the hospital. [facility] is not responsible for any missing/lost belongings or valuables.   Notify your doctor if there is any change in your medical condition (cold, fever, infection).  Wear comfortable clothing (specific to your surgery type) to the hospital.  After surgery, you can help prevent lung complications by doing breathing exercises.  Take deep breaths and cough every 1-2 hours. Your doctor may order a device called an Incentive Spirometer to help you take deep breaths. When coughing or sneezing, hold a pillow firmly against your incision with both hands. This is called "splinting." Doing this helps protect your incision. It also decreases belly discomfort.  If you are being admitted to the hospital overnight, leave your suitcase in the car. After surgery it may be brought to your room.  In case of increased patient census, it may be necessary for you, the patient, to continue your postoperative care in the Same Day Surgery department.  If you are being discharged the day of surgery, you will not be allowed to  drive home. You will need a responsible individual to drive you home and stay with you for 24 hours after surgery.   If you are taking public transportation, you will need to have a responsible individual with you.  Please call the Pre-admissions Testing Dept. at [phone] if you have any questions about these instructions.  Surgery  Visitation Policy:  Patients having surgery or a procedure may have two visitors.  Children under the age of 64 must have an adult with them who is not the patient.  Inpatient Visitation:    Visiting hours are 7 a.m. to 8 p.m. Up to four visitors are allowed at one time in a patient room. The visitors may rotate out with other people during the day.  One visitor age 71 or older may stay with the patient overnight and must be in the room by 8 p.m.    Preparing for Surgery with CHLORHEXIDINE GLUCONATE (CHG) Soap  Chlorhexidine Gluconate (CHG) Soap  o An antiseptic cleaner that kills germs and bonds with the skin to continue killing germs even after washing  o Used for showering the night before surgery and morning of surgery  Before surgery, you can play an important role by reducing the number of germs on your skin.  CHG (Chlorhexidine gluconate) soap is an antiseptic cleanser which kills germs and bonds with the skin to continue killing germs even after washing.  Please do not use if you have an allergy to CHG or antibacterial soaps. If your skin becomes reddened/irritated stop using the CHG.  1. Shower the NIGHT BEFORE SURGERY and the MORNING OF SURGERY with CHG soap.  2. If you choose to wash your hair, wash your hair first as usual with your normal shampoo.  3. After shampooing, rinse your hair and body thoroughly to remove the shampoo.  4. Use CHG as you would any other liquid soap. You can apply CHG directly to the skin and wash gently with a scrungie or a clean washcloth.  5. Apply the CHG soap to your body only from the neck down. Do not use on open wounds or open sores. Avoid contact with your eyes, ears, mouth, and genitals (private parts). Wash face and genitals (private parts) with your normal soap.  6. Wash thoroughly, paying special attention to the area where your surgery will be performed.  7. Thoroughly rinse your body with warm water.  8. Do not shower/wash  with your normal soap after using and rinsing off the CHG soap.  9. Pat yourself dry with a clean towel.  10. Wear clean pajamas to bed the night before surgery.  12. Place clean sheets on your bed the night of your first shower and do not sleep with pets.  13. Shower again with the CHG soap on the day of surgery prior to arriving at the hospital.  14. Do not apply any deodorants/lotions/powders.  15. Please wear clean clothes to the hospital.

## 2022-09-08 ENCOUNTER — Encounter: Payer: Self-pay | Admitting: General Surgery

## 2022-09-08 NOTE — Progress Notes (Signed)
Perioperative / Anesthesia Services  Pre-Admission Testing Clinical Review / Preoperative Anesthesia Consult  Date: 09/08/22  Patient Demographics:  Name: Yvonne White DOB:   06/25/1945 MRN:   [id number]  Planned Surgical Procedure(s):    Case: [id number] Date/Time: 09/12/22 1004   Procedure: XI ROBOTIC ASSISTED LAPAROSCOPIC CHOLECYSTECTOMY (Abdomen)   Anesthesia type: General   Pre-op diagnosis: K82.4 gallbladder polyp   Location: [facility] OR ROOM 06 / [facility]   Surgeons: Carolan Shiver, MD     NOTE: Available PAT nursing documentation and vital signs have been reviewed. Clinical nursing staff has updated patient's PMH/PSHx, current medication list, and drug allergies/intolerances to ensure comprehensive history available to assist in medical decision making as it pertains to the aforementioned surgical procedure and anticipated anesthetic course. Extensive review of available clinical information personally performed. [place] [facility] and PSHx updated with any diagnoses/procedures that  may have been inadvertently omitted during her intake with the pre-admission testing department's nursing staff.  Clinical Discussion:  Yvonne White is a 77 y.o. female who is submitted for pre-surgical anesthesia review and clearance prior to her undergoing the above procedure. Patient is a Former Games developer. Pertinent PMH includes: CAD, diastolic dysfunction, aortic atherosclerosis, angina, HLD, GERD (on daily PPI), hiatal hernia, OA, scoliosis, gallbladder polyp.  Patient is followed by cardiology Juliann Pares, MD). She was last seen in the cardiology clinic on 07/07/2022; notes reviewed. At the time of her clinic visit, patient doing well overall from a cardiovascular perspective. Patient denied any chest pain, shortness of breath, PND, orthopnea, palpitations, significant peripheral edema, weakness, fatigue, vertiginous symptoms, or presyncope/syncope. Patient with a past medical  history significant for cardiovascular diagnoses. Documented physical exam was grossly benign, providing no evidence of acute exacerbation and/or decompensation of the patient's known cardiovascular conditions.  TTE performed on 06/19/2020 revealed a low normal left ventricular systolic function with an EF of 50%.  There were no regional wall motion abnormalities. Left ventricular diastolic Doppler parameters consistent with abnormal relaxation (G1DD).  Right ventricular size and function normal.  There was trivial to mild pan valvular regurgitation.  All transvalvular gradients were noted to be normal providing no evidence suggestive of valvular stenosis.  Myocardial perfusion imaging study performed on 06/19/2020 revealed a mildly reduced left ventricular systolic function with an EF of 45-50%.  There was global left ventricular hypokinesis.  There was no evidence of stress-induced myocardial ischemia or arrhythmia; no scintigraphic evidence of scar.  Study determined to be low risk overall.  Coronary CTA performed on 07/08/2022 revealed an elevated coronary calcium score of 268, which was the 75th percentile for age and sex matched control.  Coronary calcium score was 100-2 99 in the LAD and RCA distributions.  Study also revealed a moderate-sized retrocardiac hiatal hernia, numerous hepatic cysts, and aortic atherosclerosis.  Blood pressure well controlled at 122/70 mmHg without the use of pharmacological interventions.  Patient not currently taking any type of lipid-lowering therapies for her HLD diagnosis and further ASCVD prevention.  She is not diabetic.  She does not have an OSAH diagnosis.  Patient making efforts to remain active.  She reports that she participates in a spin class x 1 hour twice a week. Functional capacity, as defined by DASI, is documented as being >/= 4 METS.  No changes were made to her medication regimen.  Patient to follow-up with outpatient cardiology in 1 year or sooner if  needed.   Yvonne White is scheduled for an elective XI ROBOTIC ASSISTED LAPAROSCOPIC  CHOLECYSTECTOMY (Abdomen) on 09/12/2022 with Dr. Carolan Shiver, MD.  Given patient's past medical history significant for cardiovascular diagnoses, presurgical cardiac clearance was sought by the PAT team. Per cardiology, "this patient is optimized for surgery and may proceed with the planned procedural course with a LOW risk of significant perioperative cardiovascular complications".  In review of her medication reconciliation, the patient is not noted to be taking any type of anticoagulation or antiplatelet therapies that would need to be held during her perioperative course.  Patient denies previous perioperative complications with anesthesia in the past.  There is a history of (+) PONV and a first-degree relative (mother).  In review of the available records, it is noted that patient underwent a neuraxial/regional/MAC anesthetic course at [facility] in 04/2021 without documented complications.      08/14/2022   10:29 PM 08/14/2022    5:48 PM 08/14/2022    5:47 PM  Vitals with BMI  Height   5\' 4"   Weight   127 lbs  BMI   21.79  Systolic 148 152   Diastolic 88 87   Pulse 75 74     Providers/Specialists:   NOTE: Primary physician provider listed below. Patient may have been seen by APP or partner within same practice.   PROVIDER ROLE / SPECIALTY LAST Beverely Pace, MD General Surgery (Surgeon) 09/02/2022  Barbette Reichmann, MD Primary Care Provider 06/18/2022  Rudean Hitt, MD Cardiology 07/07/2022   Allergies:  Patient has no active allergies.  Current Home Medications:   No current facility-administered medications for this encounter.    acetaminophen (TYLENOL) 500 MG tablet   Ascorbic Acid (VITAMIN C) 500 MG CHEW   Calcium Carb-Cholecalciferol (CALCIUM-VITAMIN D3) 600-400 MG-UNIT TABS   diclofenac (VOLTAREN) 75 MG EC tablet   metoCLOPramide  (REGLAN) 10 MG tablet   Misc Natural Products (ELDERBERRY IMMUNE COMPLEX PO)   Multiple Vitamins-Minerals (MULTIVITAMIN WITH MINERALS) tablet   Multiple Vitamins-Minerals (PRESERVISION AREDS 2+MULTI VIT) CAPS   pantoprazole (PROTONIX) 20 MG tablet   Vitamin D-Vitamin K (VITAMIN K2-VITAMIN D3 PO)   Zinc 30 MG TABS   History:   Past Medical History:  Diagnosis Date   Abnormal mammogram, unspecified    Anginal pain (HCC)    Aortic atherosclerosis (HCC)    Arthritis 2005   CAD (coronary artery disease) 07/08/2022   a.) cCTA 07/08/2022: Ca score 268 (75th percentile for age/sex match control)   Diastolic dysfunction 06/19/2020   a.) TTE 06/19/2020: EF 50%, no RWMAs, norm RVSF, mild AR/MR/TR, triv PR, G1DD   Diverticulosis of colon (without mention of hemorrhage)    Family history of adverse reaction to anesthesia    a.) PONV in 1st degree relative (mother)   Gall bladder polyp    GERD (gastroesophageal reflux disease)    Hepatic cyst    History of hiatal hernia    HLD (hyperlipidemia)    Personal history of colonic polyps 2000   Personal history of tobacco use, presenting hazards to health 2013   Special screening for malignant neoplasms, colon 2013   family history of colon polyp and cancer   Past Surgical History:  Procedure Laterality Date   ABDOMINAL HYSTERECTOMY  1991   COLONOSCOPY  2000,2003,2008,2013   COLONOSCOPY WITH PROPOFOL N/A 07/06/2016   Procedure: COLONOSCOPY WITH PROPOFOL;  Surgeon: Kieth Brightly, MD;  Location: [facility] ENDOSCOPY;  Service: Endoscopy;  Laterality: N/A;   EYE SURGERY     bilateral cataract removal   FLEXIBLE SIGMOIDOSCOPY  2001  FOOT SURGERY  2009,2010, [phone]   HAND SURGERY Left 2012   ORIF WRIST FRACTURE Left 11/11/2017   Procedure: OPEN REDUCTION INTERNAL FIXATION (ORIF) WRIST FRACTURE AND REPAIR;  Surgeon: Dominica Severin, MD;  Location: [facility] OR;  Service: Orthopedics;  Laterality: Left;  90 MINS   TONSILLECTOMY     TOTAL KNEE  ARTHROPLASTY Right    TOTAL KNEE ARTHROPLASTY Left 05/19/2021   Procedure: TOTAL KNEE ARTHROPLASTY;  Surgeon: Joen Laura, MD;  Location: [facility] OR;  Service: Orthopedics;  Laterality: Left;   Family History  Problem Relation Age of Onset   Colon polyps Mother        pre cancerous   Hiatal hernia Mother    Cancer Maternal Aunt        breast cancer   Breast cancer Maternal Aunt 43   Colon polyps Sister    Diverticulitis Father    Social History   Tobacco Use   Smoking status: Former    Years: 20    Types: Cigarettes   Smokeless tobacco: Never   Tobacco comments:    quit 2005  Vaping Use   Vaping Use: Never used  Substance Use Topics   Alcohol use: Yes    Alcohol/week: 7.0 standard drinks of alcohol    Types: 7 Glasses of wine per week    Comment: 1/2 glass of wine nightly. Occasional beer.   Drug use: No    Pertinent Clinical Results:  LABS:   No visits with results within 3 Day(s) from this visit.  Latest known visit with results is:  Admission on 08/14/2022, Discharged on 08/14/2022  Component Date Value Ref Range Status   Lipase 08/14/2022 43  11 - 51 U/L Final   Performed at [facility], [address] [place], [place] [address]   Sodium 08/14/2022 138  135 - 145 mmol/L Final   Potassium 08/14/2022 4.3  3.5 - 5.1 mmol/L Final   Chloride 08/14/2022 104  98 - 111 mmol/L Final   CO2 08/14/2022 25  22 - 32 mmol/L Final   Glucose, Bld 08/14/2022 101 (H)  70 - 99 mg/dL Final   Glucose reference range applies only to samples taken after fasting for at least 8 hours.   BUN 08/14/2022 17  8 - 23 mg/dL Final   Creatinine, Ser 08/14/2022 0.83  0.44 - 1.00 mg/dL Final   Calcium 11/91/4782 9.3  8.9 - 10.3 mg/dL Final   Total Protein 95/62/1308 7.2  6.5 - 8.1 g/dL Final   Albumin 65/78/4696 4.3  3.5 - 5.0 g/dL Final   AST 29/52/8413 26  15 - 41 U/L Final   ALT 08/14/2022 23  0 - 44 U/L Final   Alkaline Phosphatase 08/14/2022 66  38 - 126 U/L Final    Total Bilirubin 08/14/2022 0.8  0.3 - 1.2 mg/dL Final   GFR, Estimated 08/14/2022 >60  >60 mL/min Final   Comment: (NOTE) Calculated using the CKD-EPI Creatinine Equation (2021)    Anion gap 08/14/2022 9  5 - 15 Final   Performed at [facility], [address] [place], [place] [address]   WBC 08/14/2022 5.3  4.0 - 10.5 K/uL Final   RBC 08/14/2022 4.16  3.87 - 5.11 MIL/uL Final   Hemoglobin 08/14/2022 12.8  12.0 - 15.0 g/dL Final   HCT 02/72/5366 39.4  36.0 - 46.0 % Final   MCV 08/14/2022 94.7  80.0 - 100.0 fL Final   MCH 08/14/2022 30.8  26.0 - 34.0 pg Final  MCHC 08/14/2022 32.5  30.0 - 36.0 g/dL Final   RDW 16/01/9603 13.2  11.5 - 15.5 % Final   Platelets 08/14/2022 267  150 - 400 K/uL Final   nRBC 08/14/2022 0.0  0.0 - 0.2 % Final   Performed at [facility], [address] [place], [place] [address]   Color, Urine 08/14/2022 STRAW (A)  YELLOW Final   APPearance 08/14/2022 CLEAR (A)  CLEAR Final   Specific Gravity, Urine 08/14/2022 1.008  1.005 - 1.030 Final   pH 08/14/2022 6.0  5.0 - 8.0 Final   Glucose, UA 08/14/2022 NEGATIVE  NEGATIVE mg/dL Final   Hgb urine dipstick 08/14/2022 NEGATIVE  NEGATIVE Final   Bilirubin Urine 08/14/2022 NEGATIVE  NEGATIVE Final   Ketones, ur 08/14/2022 NEGATIVE  NEGATIVE mg/dL Final   Protein, ur 11/91/4782 NEGATIVE  NEGATIVE mg/dL Final   Nitrite 95/62/1308 NEGATIVE  NEGATIVE Final   Leukocytes,Ua 08/14/2022 NEGATIVE  NEGATIVE Final   Performed at [facility], [address] [place], [place] [address]    ECG: Date: 08/14/2022 Time ECG obtained: 1756 PM Rate: 63 bpm Rhythm: normal sinus Axis (leads I and aVF): Normal Intervals: PR 152 ms. QRS 92 ms. QTc 442 ms. ST segment and T wave changes: No evidence of acute ST segment elevation or depression Comparison: Similar to previous tracing obtained on 07/08/2021   IMAGING / PROCEDURES: US ABDOMEN LIMITED RUQ (LIVER/GB) performed on 08/23/2022 There is a 10  mm polyp within the gallbladder lumen. Given the size and appearance, surgical consultation is warranted.  Innumerable cysts visualized throughout the liver No cholelithiasis or sonographic evidence for acute cholecystitis. These results will be called to the ordering clinician or representative by the Radiologist Assistant, and communication documented in the PACS or [employer].  CT ABDOMEN PELVIS W CONTRAST performed on 08/14/2022 No acute abnormality in the abdomen/pelvis. Colonic diverticulosis without diverticulitis. Moderate hiatal hernia. Aortic atherosclerosis  CT CARDIAC SCORING performed on 07/08/2022 Moderate-sized retrocardiac hiatal hernia. Numerous hepatic cysts, needing no further imaging follow-up. Aortic atherosclerosis Coronary calcium score of 268. This was 75th percentile for age and sex matched control. CAC 100-299 in LAD and RCA. CAC-DRS A2/N2. Consider aspirin and statin if no contraindications. Continue heart healthy lifestyle and risk factor modification.  MYOCARDIAL PERFUSION IMAGING STUDY (LEXISCAN) performed on 06/19/2020 Mildly reduced left ventricular systolic function with an EF of 45-50% Global left ventricular hypokinesis No clear evidence of any defects with what appears to be normal perfusion and normal ventricular size Low risk scan  Impression and Plan:  Yvonne White has been referred for pre-anesthesia review and clearance prior to her undergoing the planned anesthetic and procedural courses. Available labs, pertinent testing, and imaging results were personally reviewed by me in preparation for upcoming operative/procedural course. [facility] medical record has been updated following extensive record review and patient interview with PAT staff.   This patient has been appropriately cleared by cardiology with an overall LOW risk of significant perioperative cardiovascular complications. Based on clinical review performed today (09/08/22),  barring any significant acute changes in the patient's overall condition, it is anticipated that she will be able to proceed with the planned surgical intervention. Any acute changes in clinical condition may necessitate her procedure being postponed and/or cancelled. Patient will meet with anesthesia team (MD and/or CRNA) on the day of her procedure for preoperative evaluation/assessment. Questions regarding anesthetic course will be fielded at that time.   Pre-surgical instructions were reviewed with the patient during her PAT appointment,  and questions were fielded to satisfaction by PAT clinical staff. She has been instructed on which medications that she will need to hold prior to surgery, as well as the ones that have been deemed safe/appropriate to take on the day of her procedure. As part of the general education provided by PAT, patient made aware both verbally and in writing, that she would need to abstain from the use of any illegal substances during her perioperative course.  She was advised that failure to follow the provided instructions could necessitate case cancellation or result in serious perioperative complications up to and including death. Patient encouraged to contact PAT and/or her surgeon's office to discuss any questions or concerns that may arise prior to surgery; verbalized understanding.   Quentin Mulling, MSN, APRN, FNP-C, CEN [facility]  Peri-operative Services Nurse Practitioner Phone: [phone] Fax: [phone] 09/08/22 3:20 PM  NOTE: This note has been prepared using Dragon dictation software. Despite my best ability to proofread, there is always the potential that unintentional transcriptional errors may still occur from this process.

## 2022-09-12 ENCOUNTER — Encounter: Payer: Self-pay | Admitting: General Surgery

## 2022-09-12 ENCOUNTER — Ambulatory Visit: Payer: Medicare HMO | Admitting: Urgent Care

## 2022-09-12 ENCOUNTER — Other Ambulatory Visit: Payer: Self-pay

## 2022-09-12 ENCOUNTER — Ambulatory Visit
Admission: RE | Admit: 2022-09-12 | Discharge: 2022-09-12 | Disposition: A | Discharge status: Home / Self Care | Payer: Medicare HMO | Source: Ambulatory Visit | Attending: General Surgery | Admitting: General Surgery

## 2022-09-12 ENCOUNTER — Encounter
Admission: RE | Disposition: A | Discharge status: Home / Self Care | Payer: Self-pay | Source: Ambulatory Visit | Attending: General Surgery

## 2022-09-12 DIAGNOSIS — Z87891 Personal history of nicotine dependence: Secondary | ICD-10-CM | POA: Insufficient documentation

## 2022-09-12 DIAGNOSIS — K219 Gastro-esophageal reflux disease without esophagitis: Secondary | ICD-10-CM | POA: Insufficient documentation

## 2022-09-12 DIAGNOSIS — I25119 Atherosclerotic heart disease of native coronary artery with unspecified angina pectoris: Secondary | ICD-10-CM | POA: Diagnosis not present

## 2022-09-12 DIAGNOSIS — M1990 Unspecified osteoarthritis, unspecified site: Secondary | ICD-10-CM | POA: Diagnosis not present

## 2022-09-12 DIAGNOSIS — I509 Heart failure, unspecified: Secondary | ICD-10-CM | POA: Diagnosis not present

## 2022-09-12 DIAGNOSIS — K811 Chronic cholecystitis: Secondary | ICD-10-CM | POA: Insufficient documentation

## 2022-09-12 DIAGNOSIS — K828 Other specified diseases of gallbladder: Secondary | ICD-10-CM | POA: Insufficient documentation

## 2022-09-12 DIAGNOSIS — K449 Diaphragmatic hernia without obstruction or gangrene: Secondary | ICD-10-CM | POA: Insufficient documentation

## 2022-09-12 DIAGNOSIS — E785 Hyperlipidemia, unspecified: Secondary | ICD-10-CM | POA: Diagnosis not present

## 2022-09-12 DIAGNOSIS — K824 Cholesterolosis of gallbladder: Secondary | ICD-10-CM | POA: Insufficient documentation

## 2022-09-12 HISTORY — DX: Cholesterolosis of gallbladder: K82.4

## 2022-09-12 HISTORY — DX: Hyperlipidemia, unspecified: E78.5

## 2022-09-12 HISTORY — DX: Scoliosis, unspecified: M41.9

## 2022-09-12 HISTORY — DX: Atherosclerosis of aorta: I70.0

## 2022-09-12 HISTORY — DX: Other specified diseases of liver: K76.89

## 2022-09-12 SURGERY — CHOLECYSTECTOMY, ROBOT-ASSISTED, LAPAROSCOPIC
Anesthesia: General | Site: Abdomen

## 2022-09-12 MED ORDER — LACTATED RINGERS IV SOLN: INTRAVENOUS | Status: DC

## 2022-09-12 MED ORDER — ONDANSETRON HCL 4 MG/2ML IJ SOLN
INTRAMUSCULAR | Status: AC
  Filled 2022-09-12: qty 2

## 2022-09-12 MED ORDER — PROPOFOL 1000 MG/100ML IV EMUL
INTRAVENOUS | Status: AC
  Filled 2022-09-12: qty 100

## 2022-09-12 MED ORDER — CHLORHEXIDINE GLUCONATE 0.12 % MT SOLN
15.0000 mL | Freq: Once | OROMUCOSAL | Status: AC
  Administered 2022-09-12: 15 mL via OROMUCOSAL

## 2022-09-12 MED ORDER — PHENYLEPHRINE HCL (PRESSORS) 10 MG/ML IV SOLN
INTRAVENOUS | Status: DC | PRN
  Administered 2022-09-12: 80 ug via INTRAVENOUS
  Administered 2022-09-12: 160 ug via INTRAVENOUS
  Administered 2022-09-12: 80 ug via INTRAVENOUS

## 2022-09-12 MED ORDER — CHLORHEXIDINE GLUCONATE 0.12 % MT SOLN
OROMUCOSAL | Status: AC
  Filled 2022-09-12: qty 15

## 2022-09-12 MED ORDER — BUPIVACAINE-EPINEPHRINE 0.25% -1:200000 IJ SOLN
INTRAMUSCULAR | Status: DC | PRN
  Administered 2022-09-12: 30 mL

## 2022-09-12 MED ORDER — FENTANYL CITRATE (PF) 100 MCG/2ML IJ SOLN
25.0000 ug | INTRAMUSCULAR | Status: DC | PRN
  Administered 2022-09-12: 50 ug via INTRAVENOUS
  Administered 2022-09-12: 25 ug via INTRAVENOUS

## 2022-09-12 MED ORDER — ORAL CARE MOUTH RINSE: 15.0000 mL | Freq: Once | OROMUCOSAL | Status: AC

## 2022-09-12 MED ORDER — HYDROCODONE-ACETAMINOPHEN 5-325 MG PO TABS: 1.0000 | ORAL_TABLET | ORAL | 0 refills | Status: AC | PRN

## 2022-09-12 MED ORDER — BUPIVACAINE HCL (PF) 0.25 % IJ SOLN
INTRAMUSCULAR | Status: AC
  Filled 2022-09-12: qty 30

## 2022-09-12 MED ORDER — CEFAZOLIN SODIUM-DEXTROSE 2-4 GM/100ML-% IV SOLN
2.0000 g | INTRAVENOUS | Status: AC
  Administered 2022-09-12: 2 g via INTRAVENOUS

## 2022-09-12 MED ORDER — FENTANYL CITRATE (PF) 100 MCG/2ML IJ SOLN
INTRAMUSCULAR | Status: DC | PRN
  Administered 2022-09-12: 50 ug via INTRAVENOUS

## 2022-09-12 MED ORDER — LIDOCAINE HCL (CARDIAC) PF 100 MG/5ML IV SOSY
PREFILLED_SYRINGE | INTRAVENOUS | Status: DC | PRN
  Administered 2022-09-12: 80 mg via INTRAVENOUS

## 2022-09-12 MED ORDER — SUGAMMADEX SODIUM 200 MG/2ML IV SOLN
INTRAVENOUS | Status: DC | PRN
  Administered 2022-09-12: 400 mg via INTRAVENOUS

## 2022-09-12 MED ORDER — FENTANYL CITRATE (PF) 100 MCG/2ML IJ SOLN
INTRAMUSCULAR | Status: AC
  Filled 2022-09-12: qty 2

## 2022-09-12 MED ORDER — OXYCODONE HCL 5 MG/5ML PO SOLN: 5.0000 mg | Freq: Once | ORAL | Status: AC | PRN

## 2022-09-12 MED ORDER — ROCURONIUM BROMIDE 10 MG/ML (PF) SYRINGE
PREFILLED_SYRINGE | INTRAVENOUS | Status: AC
  Filled 2022-09-12: qty 10

## 2022-09-12 MED ORDER — PHENYLEPHRINE HCL-NACL 20-0.9 MG/250ML-% IV SOLN
INTRAVENOUS | Status: DC | PRN
  Administered 2022-09-12: 32 ug/min via INTRAVENOUS

## 2022-09-12 MED ORDER — INDOCYANINE GREEN 25 MG IV SOLR
1.2500 mg | Freq: Once | INTRAVENOUS | Status: AC
  Administered 2022-09-12: 1.25 mg via INTRAVENOUS
  Filled 2022-09-12: qty 0.5

## 2022-09-12 MED ORDER — ROCURONIUM BROMIDE 100 MG/10ML IV SOLN
INTRAVENOUS | Status: DC | PRN
  Administered 2022-09-12: 60 mg via INTRAVENOUS

## 2022-09-12 MED ORDER — OXYCODONE HCL 5 MG PO TABS
5.0000 mg | ORAL_TABLET | Freq: Once | ORAL | Status: AC | PRN
  Administered 2022-09-12: 5 mg via ORAL

## 2022-09-12 MED ORDER — OXYCODONE HCL 5 MG PO TABS
ORAL_TABLET | ORAL | Status: AC
  Filled 2022-09-12: qty 1

## 2022-09-12 MED ORDER — LIDOCAINE HCL (PF) 2 % IJ SOLN
INTRAMUSCULAR | Status: AC
  Filled 2022-09-12: qty 5

## 2022-09-12 MED ORDER — ONDANSETRON HCL 4 MG/2ML IJ SOLN
INTRAMUSCULAR | Status: DC | PRN
  Administered 2022-09-12: 4 mg via INTRAVENOUS

## 2022-09-12 MED ORDER — PROPOFOL 10 MG/ML IV BOLUS
INTRAVENOUS | Status: DC | PRN
  Administered 2022-09-12: 150 mg via INTRAVENOUS

## 2022-09-12 MED ORDER — PROPOFOL 10 MG/ML IV BOLUS
INTRAVENOUS | Status: AC
  Filled 2022-09-12: qty 20

## 2022-09-12 MED ORDER — DEXAMETHASONE SODIUM PHOSPHATE 10 MG/ML IJ SOLN
INTRAMUSCULAR | Status: DC | PRN
  Administered 2022-09-12: 10 mg via INTRAVENOUS

## 2022-09-12 MED ORDER — EPINEPHRINE PF 1 MG/ML IJ SOLN
INTRAMUSCULAR | Status: AC
  Filled 2022-09-12: qty 1

## 2022-09-12 MED ORDER — CEFAZOLIN SODIUM-DEXTROSE 2-4 GM/100ML-% IV SOLN
INTRAVENOUS | Status: AC
  Filled 2022-09-12: qty 100

## 2022-09-12 MED ORDER — PROPOFOL 500 MG/50ML IV EMUL
INTRAVENOUS | Status: DC | PRN
  Administered 2022-09-12: 150 ug/kg/min via INTRAVENOUS

## 2022-09-12 MED ORDER — DEXAMETHASONE SODIUM PHOSPHATE 10 MG/ML IJ SOLN
INTRAMUSCULAR | Status: AC
  Filled 2022-09-12: qty 1

## 2022-09-12 SURGICAL SUPPLY — 52 items
ADH SKN CLS APL DERMABOND .7 (GAUZE/BANDAGES/DRESSINGS) ×2
BAG PRESSURE INF REUSE 1000 (BAG) IMPLANT
BLADE SURG SZ11 CARB STEEL (BLADE) ×2 IMPLANT
CANNULA REDUCER 12-8 DVNC XI (CANNULA) ×2 IMPLANT
CATH REDDICK CHOLANGI 4FR 50CM (CATHETERS) IMPLANT
CAUTERY HOOK MNPLR 1.6 DVNC XI (INSTRUMENTS) ×2 IMPLANT
CLIP LIGATING HEM O LOK PURPLE (MISCELLANEOUS) IMPLANT
CLIP LIGATING HEMO O LOK GREEN (MISCELLANEOUS) ×2 IMPLANT
DERMABOND ADVANCED .7 DNX12 (GAUZE/BANDAGES/DRESSINGS) ×2 IMPLANT
DRAPE ARM DVNC X/XI (DISPOSABLE) ×8 IMPLANT
DRAPE C-ARM XRAY 36X54 (DRAPES) IMPLANT
DRAPE COLUMN DVNC XI (DISPOSABLE) ×2 IMPLANT
ELECT REM PT RETURN 9FT ADLT (ELECTROSURGICAL) ×2
ELECTRODE REM PT RTRN 9FT ADLT (ELECTROSURGICAL) ×2 IMPLANT
FORCEPS BPLR 8 MD DVNC XI (FORCEP) ×2 IMPLANT
FORCEPS BPLR R/ABLATION 8 DVNC (INSTRUMENTS) ×2 IMPLANT
FORCEPS PROGRASP DVNC XI (FORCEP) ×2 IMPLANT
GLOVE BIO SURGEON STRL SZ 6.5 (GLOVE) ×4 IMPLANT
GLOVE BIOGEL PI IND STRL 6.5 (GLOVE) ×4 IMPLANT
GOWN STRL REUS W/ TWL LRG LVL3 (GOWN DISPOSABLE) ×6 IMPLANT
GOWN STRL REUS W/TWL LRG LVL3 (GOWN DISPOSABLE) ×6
GRASPER SUT TROCAR 14GX15 (MISCELLANEOUS) ×2 IMPLANT
IRRIGATOR SUCT 8 DISP DVNC XI (IRRIGATION / IRRIGATOR) IMPLANT
IV CATH ANGIO 12GX3 LT BLUE (NEEDLE) IMPLANT
IV NS 1000ML (IV SOLUTION)
IV NS 1000ML BAXH (IV SOLUTION) IMPLANT
KIT PINK PAD W/HEAD ARE REST (MISCELLANEOUS) ×2
KIT PINK PAD W/HEAD ARM REST (MISCELLANEOUS) ×2 IMPLANT
LABEL OR SOLS (LABEL) ×2 IMPLANT
MANIFOLD NEPTUNE II (INSTRUMENTS) ×2 IMPLANT
NDL HYPO 22X1.5 SAFETY MO (MISCELLANEOUS) ×2 IMPLANT
NDL INSUFFLATION 14GA 120MM (NEEDLE) ×2 IMPLANT
NEEDLE HYPO 22X1.5 SAFETY MO (MISCELLANEOUS) ×2 IMPLANT
NEEDLE INSUFFLATION 14GA 120MM (NEEDLE) ×2 IMPLANT
NS IRRIG 500ML POUR BTL (IV SOLUTION) ×2 IMPLANT
OBTURATOR OPTICAL STND 8 DVNC (TROCAR) ×2
OBTURATOR OPTICALSTD 8 DVNC (TROCAR) ×2 IMPLANT
PACK LAP CHOLECYSTECTOMY (MISCELLANEOUS) ×2 IMPLANT
SEAL UNIV 5-12 XI (MISCELLANEOUS) ×8 IMPLANT
SET TUBE SMOKE EVAC HIGH FLOW (TUBING) ×2 IMPLANT
SOL ELECTROSURG ANTI STICK (MISCELLANEOUS) ×2
SOLUTION ELECTROSURG ANTI STCK (MISCELLANEOUS) ×2 IMPLANT
SPIKE FLUID TRANSFER (MISCELLANEOUS) ×4 IMPLANT
SPONGE T-LAP 4X18 ~~LOC~~+RFID (SPONGE) IMPLANT
SUT MNCRL 4-0 (SUTURE) ×2
SUT MNCRL 4-0 27XMFL (SUTURE) ×2
SUT VICRYL 0 UR6 27IN ABS (SUTURE) ×2 IMPLANT
SUTURE MNCRL 4-0 27XMF (SUTURE) ×2 IMPLANT
SYS BAG RETRIEVAL 10MM (BASKET) ×2
SYSTEM BAG RETRIEVAL 10MM (BASKET) ×2 IMPLANT
TRAP FLUID SMOKE EVACUATOR (MISCELLANEOUS) ×2 IMPLANT
WATER STERILE IRR 500ML POUR (IV SOLUTION) ×2 IMPLANT

## 2022-09-12 NOTE — Anesthesia Postprocedure Evaluation (Signed)
Anesthesia Post Note  Patient: Yvonne White  Procedure(s) Performed: XI ROBOTIC ASSISTED LAPAROSCOPIC CHOLECYSTECTOMY (Abdomen) INDOCYANINE GREEN FLUORESCENCE IMAGING (ICG)  Patient location during evaluation: PACU Anesthesia Type: General Level of consciousness: awake and alert Pain management: pain level controlled Vital Signs Assessment: post-procedure vital signs reviewed and stable Respiratory status: spontaneous breathing, nonlabored ventilation, respiratory function stable and patient connected to nasal cannula oxygen Cardiovascular status: blood pressure returned to baseline and stable Postop Assessment: no apparent nausea or vomiting Anesthetic complications: no  No notable events documented.   Last Vitals:  Vitals:   09/12/22 1106 09/12/22 1118  BP:  137/84  Pulse: 78 65  Resp: 16 14  Temp:  (!) 36.1 C  SpO2: 100% 97%    Last Pain:  Vitals:   09/12/22 1118  TempSrc: Temporal  PainSc: 4                  Stephanie Coup

## 2022-09-12 NOTE — Discharge Instructions (Addendum)
  Diet: Resume home heart healthy regular diet.   Activity: No heavy lifting >20 pounds (children, pets, laundry, garbage) or strenuous activity until follow-up, but light activity and walking are encouraged. Do not drive or drink alcohol if taking narcotic pain medications.  Wound care: May shower with soapy water and pat dry (do not rub incisions), but no baths or submerging incision underwater until follow-up. (no swimming)   Medications: Resume all home medications. For mild to moderate pain: acetaminophen (Tylenol) ***or ibuprofen (if no kidney disease). Combining Tylenol with alcohol can substantially increase your risk of causing liver disease. Narcotic pain medications, if prescribed, can be used for severe pain, though may cause nausea, constipation, and drowsiness. Do not combine Tylenol and Norco within a 6 hour period as Norco contains Tylenol. If you do not need the narcotic pain medication, you do not need to fill the prescription.  Call office ([phone]) at any time if any questions, worsening pain, fevers/chills, bleeding, drainage from incision site, or other concerns.   AMBULATORY SURGERY  DISCHARGE INSTRUCTIONS   The drugs that you were given will stay in your system until tomorrow so for the next 24 hours you should not:  Drive an automobile Make any legal decisions Drink any alcoholic beverage   You may resume regular meals tomorrow.  Today it is better to start with liquids and gradually work up to solid foods.  You may eat anything you prefer, but it is better to start with liquids, then soup and crackers, and gradually work up to solid foods.   Please notify your doctor immediately if you have any unusual bleeding, trouble breathing, redness and pain at the surgery site, drainage, fever, or pain not relieved by medication.    Additional Instructions:        Please contact your physician with any problems or Same Day Surgery at [phone], Monday  through Friday 6 am to 4 pm, or [place] [facility] [place] Main number at [phone].  

## 2022-09-12 NOTE — Transfer of Care (Signed)
Immediate Anesthesia Transfer of Care Note  Patient: Yvonne White  Procedure(s) Performed: XI ROBOTIC ASSISTED LAPAROSCOPIC CHOLECYSTECTOMY (Abdomen) INDOCYANINE GREEN FLUORESCENCE IMAGING (ICG)  Patient Location: PACU  Anesthesia Type:General  Level of Consciousness: drowsy  Airway & Oxygen Therapy: Patient Spontanous Breathing and Patient connected to face mask oxygen  Post-op Assessment: Report given to RN and Post -op Vital signs reviewed and stable  Post vital signs: Reviewed and stable  Last Vitals:  Vitals Value Taken Time  BP 129/77 09/12/22 1040  Temp 36.8 C 09/12/22 1040  Pulse 78 09/12/22 1044  Resp 20 09/12/22 1044  SpO2 98 % 09/12/22 1044  Vitals shown include unvalidated device data.  Last Pain:  Vitals:   09/12/22 0815  PainSc: 0-No pain         Complications: No notable events documented.

## 2022-09-12 NOTE — Anesthesia Preprocedure Evaluation (Signed)
Anesthesia Evaluation  Patient identified by MRN, date of birth, ID band Patient awake    Reviewed: Allergy & Precautions, NPO status , Patient's Chart, lab work & pertinent test results  History of Anesthesia Complications (+) PONV and history of anesthetic complications  Airway Mallampati: I  TM Distance: >3 FB Neck ROM: full    Dental  (+) Dental Advidsory Given, Teeth Intact   Pulmonary neg pulmonary ROS, neg shortness of breath, neg COPD, former smoker   Pulmonary exam normal        Cardiovascular (-) angina +CHF  Normal cardiovascular exam     Neuro/Psych negative neurological ROS  negative psych ROS   GI/Hepatic Neg liver ROS,GERD  Medicated and Controlled,,  Endo/Other  negative endocrine ROS    Renal/GU      Musculoskeletal   Abdominal   Peds  Hematology negative hematology ROS (+)   Anesthesia Other Findings Yvonne White is a 77 y.o. female who is submitted for pre-surgical anesthesia review and clearance prior to her undergoing the above procedure. Patient is a Former Games developer. Pertinent PMH includes: CAD, diastolic dysfunction, aortic atherosclerosis, angina, HLD, GERD (on daily PPI), hiatal hernia, OA, scoliosis, gallbladder polyp.   Patient is followed by cardiology Juliann Pares, MD). She was last seen in the cardiology clinic on 07/07/2022; notes reviewed. At the time of her clinic visit, patient doing well overall from a cardiovascular perspective. Patient denied any chest pain, shortness of breath, PND, orthopnea, palpitations, significant peripheral edema, weakness, fatigue, vertiginous symptoms, or presyncope/syncope. Patient with a past medical history significant for cardiovascular diagnoses. Documented physical exam was grossly benign, providing no evidence of acute exacerbation and/or decompensation of the patient's known cardiovascular conditions.    TTE performed on 06/19/2020 revealed a low normal  left ventricular systolic function with an EF of 50%.  There were no regional wall motion abnormalities. Left ventricular diastolic Doppler parameters consistent with abnormal relaxation (G1DD).  Right ventricular size and function normal.  There was trivial to mild pan valvular regurgitation.  All transvalvular gradients were noted to be normal providing no evidence suggestive of valvular stenosis.    Myocardial perfusion imaging study performed on 06/19/2020 revealed a mildly reduced left ventricular systolic function with an EF of 45-50%.  There was global left ventricular hypokinesis.  There was no evidence of stress-induced myocardial ischemia or arrhythmia; no scintigraphic evidence of scar.  Study determined to be low risk overall.    Coronary CTA performed on 07/08/2022 revealed an elevated coronary calcium score of 268, which was the 75th percentile for age and sex matched control.  Coronary calcium score was 100-2 99 in the LAD and RCA distributions.  Study also revealed a moderate-sized retrocardiac hiatal hernia, numerous hepatic cysts, and aortic atherosclerosis.   Blood pressure well controlled at 122/70 mmHg without the use of pharmacological interventions.  Patient not currently taking any type of lipid-lowering therapies for her HLD diagnosis and further ASCVD prevention.  She is not diabetic.  She does not have an OSAH diagnosis.  Patient making efforts to remain active.  She reports that she participates in a spin class x 1 hour twice a week. Functional capacity, as defined by [facility], is documented as being >/= 4 METS.  No changes were made to her medication regimen.  Patient to follow-up with outpatient cardiology in 1 year or sooner if needed.    Yvonne White is scheduled for an elective XI ROBOTIC ASSISTED LAPAROSCOPIC CHOLECYSTECTOMY (Abdomen) on 09/12/2022 with Dr. Carolan Shiver, MD.  Given patient's past medical history significant for cardiovascular diagnoses, presurgical  cardiac clearance was sought by the PAT team. Per cardiology, "this patient is optimized for surgery and may proceed with the planned procedural course with a LOW risk of significant perioperative cardiovascular complications".   In review of her medication reconciliation, the patient is not noted to be taking any type of anticoagulation or antiplatelet therapies that would need to be held during her perioperative course.   Patient denies previous perioperative complications with anesthesia in the past.  There is a history of (+) PONV and a first-degree relative (mother).  In review of the available records, it is noted that patient underwent a neuraxial/regional/MAC anesthetic course at [facility] ([facility]) in 04/2021 without documented complications.    Past Medical History: No date: Abnormal mammogram, unspecified No date: Anginal pain (HCC) No date: Aortic atherosclerosis (HCC) 2005: Arthritis 07/08/2022: CAD (coronary artery disease)     Comment:  a.) cCTA 07/08/2022: Ca score 268 (75th percentile for               age/sex match control) 06/19/2020: Diastolic dysfunction     Comment:  a.) TTE 06/19/2020: EF 50%, no RWMAs, norm RVSF, mild               AR/MR/TR, triv PR, G1DD No date: Diverticulosis of colon (without mention of hemorrhage) No date: Family history of adverse reaction to anesthesia     Comment:  a.) PONV in 1st degree relative (mother) No date: Gall bladder polyp No date: GERD (gastroesophageal reflux disease) No date: Hepatic cyst No date: History of hiatal hernia No date: HLD (hyperlipidemia) 2000: Personal history of colonic polyps 2013: Personal history of tobacco use, presenting hazards to health No date: Scoliosis 2013: Special screening for malignant neoplasms, colon     Comment:  family history of colon polyp and cancer  Past Surgical History: 1991: ABDOMINAL HYSTERECTOMY 2000,2003,2008,2013: COLONOSCOPY 07/06/2016: COLONOSCOPY WITH PROPOFOL;  N/A     Comment:  Procedure: COLONOSCOPY WITH PROPOFOL;  Surgeon:               Kieth Brightly, MD;  Location: [facility] ENDOSCOPY;                Service: Endoscopy;  Laterality: N/A; No date: EYE SURGERY     Comment:  bilateral cataract removal 2001: FLEXIBLE SIGMOIDOSCOPY [id number], [id number]: FOOT SURGERY 2012: HAND SURGERY; Left 11/11/2017: ORIF WRIST FRACTURE; Left     Comment:  Procedure: OPEN REDUCTION INTERNAL FIXATION (ORIF) WRIST              FRACTURE AND REPAIR;  Surgeon: Dominica Severin, MD;                Location: [facility] OR;  Service: Orthopedics;  Laterality:               Left;  90 MINS No date: TONSILLECTOMY No date: TOTAL KNEE ARTHROPLASTY; Right 05/19/2021: TOTAL KNEE ARTHROPLASTY; Left     Comment:  Procedure: TOTAL KNEE ARTHROPLASTY;  Surgeon:               Joen Laura, MD;  Location: [facility] OR;  Service:               Orthopedics;  Laterality: Left;  BMI    Body Mass Index: 21.80 kg/m      Reproductive/Obstetrics negative OB ROS  Anesthesia Physical Anesthesia Plan  ASA: 3  Anesthesia Plan: General ETT   Post-op Pain Management:    Induction: Intravenous  PONV Risk Score and Plan: 4 or greater and Propofol infusion, TIVA, Dexamethasone and Ondansetron  Airway Management Planned: Oral ETT  Additional Equipment:   Intra-op Plan:   Post-operative Plan: Extubation in OR  Informed Consent: I have reviewed the patients History and Physical, chart, labs and discussed the procedure including the risks, benefits and alternatives for the proposed anesthesia with the patient or authorized representative who has indicated his/her understanding and acceptance.     Dental Advisory Given  Plan Discussed with: Anesthesiologist, CRNA and Surgeon  Anesthesia Plan Comments: (Patient consented for risks of anesthesia including but not limited to:  - adverse reactions to medications - damage to eyes,  teeth, lips or other oral mucosa - nerve damage due to positioning  - sore throat or hoarseness - Damage to heart, brain, nerves, lungs, other parts of body or loss of life  Patient voiced understanding.)       Anesthesia Quick Evaluation

## 2022-09-12 NOTE — Interval H&P Note (Signed)
History and Physical Interval Note:  09/12/2022 9:23 AM  Yvonne White  has presented today for surgery, with the diagnosis of K82.4 gallbladder polyp.  The various methods of treatment have been discussed with the patient and family. After consideration of risks, benefits and other options for treatment, the patient has consented to  Procedure(s): XI ROBOTIC ASSISTED LAPAROSCOPIC CHOLECYSTECTOMY (N/A) INDOCYANINE GREEN FLUORESCENCE IMAGING (ICG) (N/A) as a surgical intervention.  The patient's history has been reviewed, patient examined, no change in status, stable for surgery.  I have reviewed the patient's chart and labs.  Questions were answered to the patient's satisfaction.     Carolan Shiver

## 2022-09-12 NOTE — Anesthesia Procedure Notes (Signed)
Procedure Name: Intubation Date/Time: 09/12/2022 9:45 AM  Performed by: Malva Cogan, CRNAPre-anesthesia Checklist: Patient identified, Patient being monitored, Timeout performed, Emergency Drugs available and Suction available Patient Re-evaluated:Patient Re-evaluated prior to induction Oxygen Delivery Method: Circle system utilized Preoxygenation: Pre-oxygenation with 100% oxygen Induction Type: IV induction Ventilation: Mask ventilation without difficulty Laryngoscope Size: Mac and 3 Grade View: Grade I Tube type: Oral Tube size: 7.0 mm Number of attempts: 1 Airway Equipment and Method: Stylet Placement Confirmation: ETT inserted through vocal cords under direct vision, positive ETCO2 and breath sounds checked- equal and bilateral Secured at: 21 cm Tube secured with: Tape Dental Injury: Teeth and Oropharynx as per pre-operative assessment  Comments: Inserted by Hassel Neth

## 2022-09-12 NOTE — Op Note (Signed)
Preoperative diagnosis: Gallbladder polyp  Postoperative diagnosis: Same  Procedure: Robotic Assisted Laparoscopic Cholecystectomy.   Anesthesia: GETA   Surgeon: Dr. Hazle Quant  Wound Classification: Clean Contaminated  Indications: Patient is a 77 y.o. female developed right upper quadrant pain and on workup was found to have a 10 mm gallbladder polyp. Robotic Assisted Laparoscopic cholecystectomy was elected.  Findings:  Critical view of safety achieved Cystic duct and artery identified, ligated and divided Adequate hemostasis  Description of procedure: The patient was placed on the operating table in the supine position. General anesthesia was induced. A time-out was completed verifying correct patient, procedure, site, positioning, and implant(s) and/or special equipment prior to beginning this procedure. An orogastric tube was placed. The abdomen was prepped and draped in the usual sterile fashion.  An incision was made in a natural skin line below the umbilicus.  The fascia was elevated and the Veress needle inserted. Proper position was confirmed by aspiration and saline meniscus test.  The abdomen was insufflated with carbon dioxide to a pressure of 15 mmHg. The patient tolerated insufflation well. A 8-mm trocar was then inserted in optiview fashion.  The laparoscope was inserted and the abdomen inspected. No injuries from initial trocar placement were noted. Additional trocars were then inserted in the following locations: an 8-mm trocar in the left lateral abdomen, and another two 8-mm trocars to the right side of the abdomen 5 cm appart. The umbilical trocar was changed to a 12 mm trocar all under direct visualization. The abdomen was inspected and no abnormalities were found. The table was placed in the reverse Trendelenburg position with the right side up. The robotic arms were docked and target anatomy identified. Instrument inserted under direct visualization.  Filmy  adhesions between the gallbladder and omentum, duodenum and transverse colon were lysed with electrocautery. The dome of the gallbladder was grasped with a prograsp and retracted over the dome of the liver. The infundibulum was also grasped with an atraumatic grasper and retracted toward the right lower quadrant. This maneuver exposed Calot's triangle. The peritoneum overlying the gallbladder infundibulum was then incised and the cystic duct and cystic artery identified and circumferentially dissected. Critical view of safety reviewed before ligating any structure. Firefly images taken to visualize biliary ducts. The cystic duct and cystic artery were then doubly clipped and divided close to the gallbladder.  The gallbladder was then dissected from its peritoneal attachments by electrocautery. Hemostasis was checked and the gallbladder and contained stones were removed using an endoscopic retrieval bag. The gallbladder was passed off the table as a specimen.  There was no evidence of bleeding from the gallbladder fossa or cystic artery or leakage of the bile from the cystic duct stump. Secondary trocars were removed under direct vision. No bleeding was noted. The robotic arms were undoked. The scope was withdrawn and the umbilical trocar removed. The abdomen was allowed to collapse. The fascia of the 12mm trocar sites was closed with figure-of-eight 0 vicryl sutures. The skin was closed with subcuticular sutures of 4-0 monocryl and topical skin adhesive. The orogastric tube was removed.  The patient tolerated the procedure well and was taken to the postanesthesia care unit in stable condition.   Specimen: Gallbladder  Complications: None  EBL: 5 mL

## 2022-09-15 LAB — SURGICAL PATHOLOGY

## 2023-01-15 IMAGING — DX DG KNEE 1-2V PORT*L*
2 series · 2 of 2 positions shown · non-contrast
Comparison: Left knee x-rays 01/28/2021

CLINICAL DATA: Left knee arthroplasty

EXAM:
PORTABLE LEFT KNEE - 1-2 VIEW

[knee ap]
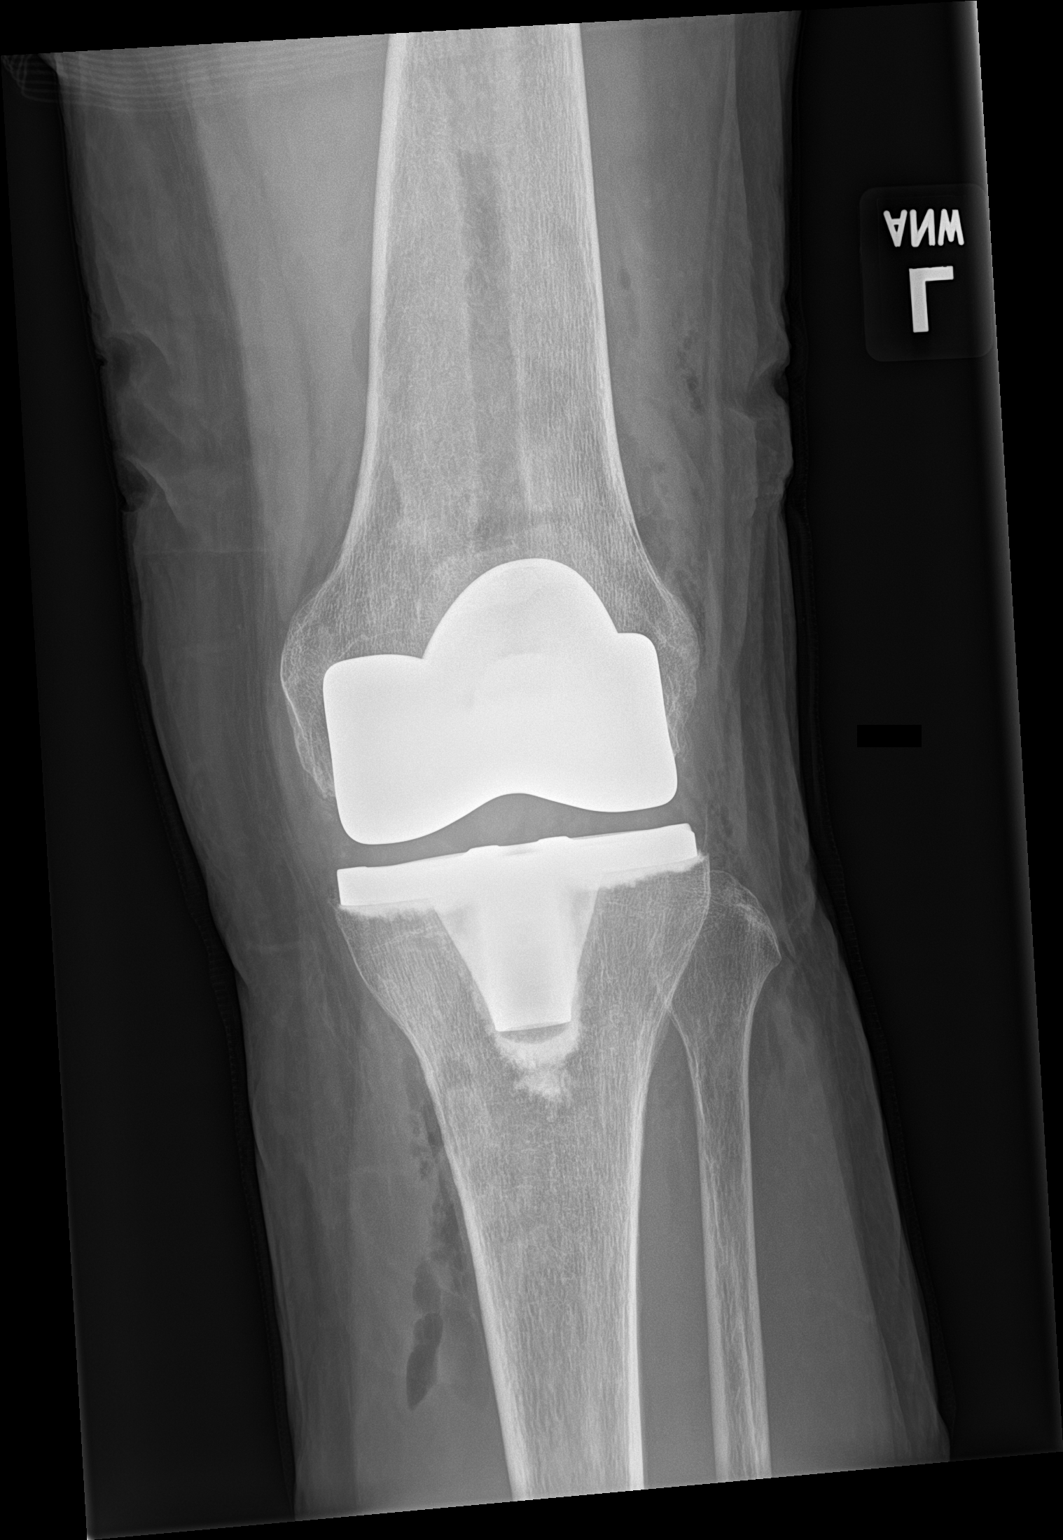

[knee lat]
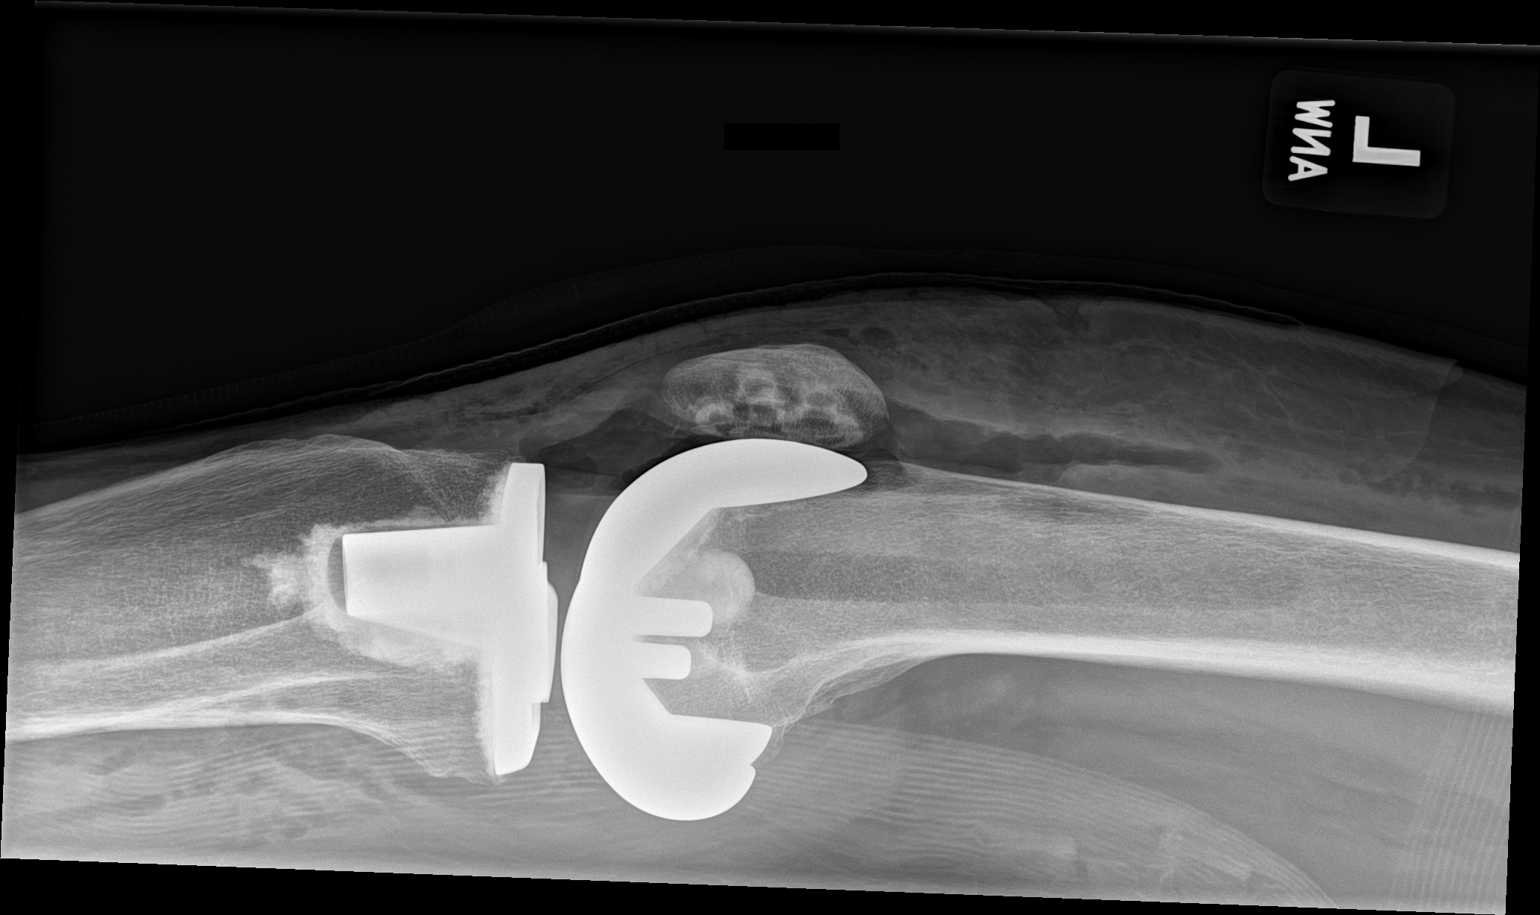

[2 of 2 positions shown; findings below may reference images not displayed]

FINDINGS: Recent postoperative changes of a total left knee arthroplasty. The
hardware appears aligned and intact. No acute fracture or
dislocation identified. Associated air within the joint and soft
tissues.
IMPRESSION: Total left knee arthroplasty.  No acute fracture identified.

## 2023-05-23 DIAGNOSIS — M9903 Segmental and somatic dysfunction of lumbar region: Secondary | ICD-10-CM | POA: Diagnosis not present

## 2023-05-23 DIAGNOSIS — M62830 Muscle spasm of back: Secondary | ICD-10-CM | POA: Diagnosis not present

## 2023-05-23 DIAGNOSIS — M9901 Segmental and somatic dysfunction of cervical region: Secondary | ICD-10-CM | POA: Diagnosis not present

## 2023-05-23 DIAGNOSIS — M5033 Other cervical disc degeneration, cervicothoracic region: Secondary | ICD-10-CM | POA: Diagnosis not present

## 2023-05-30 ENCOUNTER — Other Ambulatory Visit: Payer: Self-pay | Admitting: Internal Medicine

## 2023-05-30 DIAGNOSIS — Z1231 Encounter for screening mammogram for malignant neoplasm of breast: Secondary | ICD-10-CM

## 2023-06-08 DIAGNOSIS — M159 Polyosteoarthritis, unspecified: Secondary | ICD-10-CM | POA: Diagnosis not present

## 2023-06-08 DIAGNOSIS — Z0000 Encounter for general adult medical examination without abnormal findings: Secondary | ICD-10-CM | POA: Diagnosis not present

## 2023-06-08 DIAGNOSIS — M419 Scoliosis, unspecified: Secondary | ICD-10-CM | POA: Diagnosis not present

## 2023-06-08 DIAGNOSIS — M47896 Other spondylosis, lumbar region: Secondary | ICD-10-CM | POA: Diagnosis not present

## 2023-06-08 DIAGNOSIS — I700 Atherosclerosis of aorta: Secondary | ICD-10-CM | POA: Diagnosis not present

## 2023-06-20 DIAGNOSIS — M9903 Segmental and somatic dysfunction of lumbar region: Secondary | ICD-10-CM | POA: Diagnosis not present

## 2023-06-20 DIAGNOSIS — M62830 Muscle spasm of back: Secondary | ICD-10-CM | POA: Diagnosis not present

## 2023-06-20 DIAGNOSIS — M5033 Other cervical disc degeneration, cervicothoracic region: Secondary | ICD-10-CM | POA: Diagnosis not present

## 2023-06-20 DIAGNOSIS — M9901 Segmental and somatic dysfunction of cervical region: Secondary | ICD-10-CM | POA: Diagnosis not present

## 2023-07-04 ENCOUNTER — Ambulatory Visit
Admission: RE | Admit: 2023-07-04 | Discharge: 2023-07-04 | Disposition: A | Discharge status: Home / Self Care | Payer: Medicare HMO | Source: Ambulatory Visit | Attending: Internal Medicine | Admitting: Internal Medicine

## 2023-07-04 DIAGNOSIS — Z1231 Encounter for screening mammogram for malignant neoplasm of breast: Secondary | ICD-10-CM | POA: Insufficient documentation

## 2023-07-18 DIAGNOSIS — M5033 Other cervical disc degeneration, cervicothoracic region: Secondary | ICD-10-CM | POA: Diagnosis not present

## 2023-07-18 DIAGNOSIS — M9903 Segmental and somatic dysfunction of lumbar region: Secondary | ICD-10-CM | POA: Diagnosis not present

## 2023-07-18 DIAGNOSIS — M9901 Segmental and somatic dysfunction of cervical region: Secondary | ICD-10-CM | POA: Diagnosis not present

## 2023-07-18 DIAGNOSIS — M62830 Muscle spasm of back: Secondary | ICD-10-CM | POA: Diagnosis not present

## 2023-08-03 DIAGNOSIS — I700 Atherosclerosis of aorta: Secondary | ICD-10-CM | POA: Diagnosis not present

## 2023-08-03 DIAGNOSIS — K449 Diaphragmatic hernia without obstruction or gangrene: Secondary | ICD-10-CM | POA: Diagnosis not present

## 2023-08-03 DIAGNOSIS — Z1211 Encounter for screening for malignant neoplasm of colon: Secondary | ICD-10-CM | POA: Diagnosis not present

## 2023-08-03 DIAGNOSIS — K5790 Diverticulosis of intestine, part unspecified, without perforation or abscess without bleeding: Secondary | ICD-10-CM | POA: Diagnosis not present

## 2023-08-03 DIAGNOSIS — M419 Scoliosis, unspecified: Secondary | ICD-10-CM | POA: Diagnosis not present

## 2023-08-03 DIAGNOSIS — M159 Polyosteoarthritis, unspecified: Secondary | ICD-10-CM | POA: Diagnosis not present

## 2023-08-03 DIAGNOSIS — Z0000 Encounter for general adult medical examination without abnormal findings: Secondary | ICD-10-CM | POA: Diagnosis not present

## 2023-08-03 DIAGNOSIS — R634 Abnormal weight loss: Secondary | ICD-10-CM | POA: Diagnosis not present

## 2023-08-07 DIAGNOSIS — H353131 Nonexudative age-related macular degeneration, bilateral, early dry stage: Secondary | ICD-10-CM | POA: Diagnosis not present

## 2023-08-15 DIAGNOSIS — M62830 Muscle spasm of back: Secondary | ICD-10-CM | POA: Diagnosis not present

## 2023-08-15 DIAGNOSIS — M9903 Segmental and somatic dysfunction of lumbar region: Secondary | ICD-10-CM | POA: Diagnosis not present

## 2023-08-15 DIAGNOSIS — M9901 Segmental and somatic dysfunction of cervical region: Secondary | ICD-10-CM | POA: Diagnosis not present

## 2023-08-15 DIAGNOSIS — M5033 Other cervical disc degeneration, cervicothoracic region: Secondary | ICD-10-CM | POA: Diagnosis not present

## 2023-08-17 DIAGNOSIS — Z1211 Encounter for screening for malignant neoplasm of colon: Secondary | ICD-10-CM | POA: Diagnosis not present

## 2023-08-23 LAB — COLOGUARD: COLOGUARD: NEGATIVE

## 2023-09-11 DIAGNOSIS — M9903 Segmental and somatic dysfunction of lumbar region: Secondary | ICD-10-CM | POA: Diagnosis not present

## 2023-09-11 DIAGNOSIS — M62830 Muscle spasm of back: Secondary | ICD-10-CM | POA: Diagnosis not present

## 2023-09-11 DIAGNOSIS — M5033 Other cervical disc degeneration, cervicothoracic region: Secondary | ICD-10-CM | POA: Diagnosis not present

## 2023-09-11 DIAGNOSIS — M9901 Segmental and somatic dysfunction of cervical region: Secondary | ICD-10-CM | POA: Diagnosis not present

## 2023-10-10 DIAGNOSIS — M9903 Segmental and somatic dysfunction of lumbar region: Secondary | ICD-10-CM | POA: Diagnosis not present

## 2023-10-10 DIAGNOSIS — M5033 Other cervical disc degeneration, cervicothoracic region: Secondary | ICD-10-CM | POA: Diagnosis not present

## 2023-10-10 DIAGNOSIS — M9901 Segmental and somatic dysfunction of cervical region: Secondary | ICD-10-CM | POA: Diagnosis not present

## 2023-10-10 DIAGNOSIS — M62830 Muscle spasm of back: Secondary | ICD-10-CM | POA: Diagnosis not present

## 2023-11-07 DIAGNOSIS — M62830 Muscle spasm of back: Secondary | ICD-10-CM | POA: Diagnosis not present

## 2023-11-07 DIAGNOSIS — M5033 Other cervical disc degeneration, cervicothoracic region: Secondary | ICD-10-CM | POA: Diagnosis not present

## 2023-11-07 DIAGNOSIS — M9901 Segmental and somatic dysfunction of cervical region: Secondary | ICD-10-CM | POA: Diagnosis not present

## 2023-11-07 DIAGNOSIS — M9903 Segmental and somatic dysfunction of lumbar region: Secondary | ICD-10-CM | POA: Diagnosis not present

## 2023-12-05 DIAGNOSIS — M5033 Other cervical disc degeneration, cervicothoracic region: Secondary | ICD-10-CM | POA: Diagnosis not present

## 2023-12-05 DIAGNOSIS — M9903 Segmental and somatic dysfunction of lumbar region: Secondary | ICD-10-CM | POA: Diagnosis not present

## 2023-12-05 DIAGNOSIS — M9901 Segmental and somatic dysfunction of cervical region: Secondary | ICD-10-CM | POA: Diagnosis not present

## 2023-12-05 DIAGNOSIS — M62830 Muscle spasm of back: Secondary | ICD-10-CM | POA: Diagnosis not present

## 2024-01-01 DIAGNOSIS — M9901 Segmental and somatic dysfunction of cervical region: Secondary | ICD-10-CM | POA: Diagnosis not present

## 2024-01-01 DIAGNOSIS — M62830 Muscle spasm of back: Secondary | ICD-10-CM | POA: Diagnosis not present

## 2024-01-01 DIAGNOSIS — M5033 Other cervical disc degeneration, cervicothoracic region: Secondary | ICD-10-CM | POA: Diagnosis not present

## 2024-01-01 DIAGNOSIS — M9903 Segmental and somatic dysfunction of lumbar region: Secondary | ICD-10-CM | POA: Diagnosis not present

## 2024-01-17 DIAGNOSIS — M159 Polyosteoarthritis, unspecified: Secondary | ICD-10-CM | POA: Diagnosis not present

## 2024-01-17 DIAGNOSIS — K5790 Diverticulosis of intestine, part unspecified, without perforation or abscess without bleeding: Secondary | ICD-10-CM | POA: Diagnosis not present

## 2024-01-17 DIAGNOSIS — Z0000 Encounter for general adult medical examination without abnormal findings: Secondary | ICD-10-CM | POA: Diagnosis not present

## 2024-01-17 DIAGNOSIS — R634 Abnormal weight loss: Secondary | ICD-10-CM | POA: Diagnosis not present

## 2024-01-17 DIAGNOSIS — Z1211 Encounter for screening for malignant neoplasm of colon: Secondary | ICD-10-CM | POA: Diagnosis not present

## 2024-01-17 DIAGNOSIS — I700 Atherosclerosis of aorta: Secondary | ICD-10-CM | POA: Diagnosis not present

## 2024-01-17 DIAGNOSIS — K449 Diaphragmatic hernia without obstruction or gangrene: Secondary | ICD-10-CM | POA: Diagnosis not present

## 2024-01-24 DIAGNOSIS — K449 Diaphragmatic hernia without obstruction or gangrene: Secondary | ICD-10-CM | POA: Diagnosis not present

## 2024-01-24 DIAGNOSIS — K5790 Diverticulosis of intestine, part unspecified, without perforation or abscess without bleeding: Secondary | ICD-10-CM | POA: Diagnosis not present

## 2024-01-24 DIAGNOSIS — M419 Scoliosis, unspecified: Secondary | ICD-10-CM | POA: Diagnosis not present

## 2024-01-24 DIAGNOSIS — I700 Atherosclerosis of aorta: Secondary | ICD-10-CM | POA: Diagnosis not present

## 2024-01-24 DIAGNOSIS — Z23 Encounter for immunization: Secondary | ICD-10-CM | POA: Diagnosis not present

## 2024-01-24 DIAGNOSIS — M159 Polyosteoarthritis, unspecified: Secondary | ICD-10-CM | POA: Diagnosis not present

## 2024-01-29 DIAGNOSIS — H353131 Nonexudative age-related macular degeneration, bilateral, early dry stage: Secondary | ICD-10-CM | POA: Diagnosis not present

## 2024-01-29 DIAGNOSIS — M9901 Segmental and somatic dysfunction of cervical region: Secondary | ICD-10-CM | POA: Diagnosis not present

## 2024-01-29 DIAGNOSIS — M62830 Muscle spasm of back: Secondary | ICD-10-CM | POA: Diagnosis not present

## 2024-01-29 DIAGNOSIS — M9903 Segmental and somatic dysfunction of lumbar region: Secondary | ICD-10-CM | POA: Diagnosis not present

## 2024-01-29 DIAGNOSIS — M5033 Other cervical disc degeneration, cervicothoracic region: Secondary | ICD-10-CM | POA: Diagnosis not present

## 2024-02-12 DIAGNOSIS — M62830 Muscle spasm of back: Secondary | ICD-10-CM | POA: Diagnosis not present

## 2024-02-12 DIAGNOSIS — M9901 Segmental and somatic dysfunction of cervical region: Secondary | ICD-10-CM | POA: Diagnosis not present

## 2024-02-12 DIAGNOSIS — M9903 Segmental and somatic dysfunction of lumbar region: Secondary | ICD-10-CM | POA: Diagnosis not present

## 2024-02-12 DIAGNOSIS — M5033 Other cervical disc degeneration, cervicothoracic region: Secondary | ICD-10-CM | POA: Diagnosis not present

## 2024-02-13 DIAGNOSIS — M9901 Segmental and somatic dysfunction of cervical region: Secondary | ICD-10-CM | POA: Diagnosis not present

## 2024-02-13 DIAGNOSIS — M5033 Other cervical disc degeneration, cervicothoracic region: Secondary | ICD-10-CM | POA: Diagnosis not present

## 2024-02-13 DIAGNOSIS — M62830 Muscle spasm of back: Secondary | ICD-10-CM | POA: Diagnosis not present

## 2024-02-13 DIAGNOSIS — M9903 Segmental and somatic dysfunction of lumbar region: Secondary | ICD-10-CM | POA: Diagnosis not present

## 2024-02-14 DIAGNOSIS — M62830 Muscle spasm of back: Secondary | ICD-10-CM | POA: Diagnosis not present

## 2024-02-14 DIAGNOSIS — M9901 Segmental and somatic dysfunction of cervical region: Secondary | ICD-10-CM | POA: Diagnosis not present

## 2024-02-14 DIAGNOSIS — M9903 Segmental and somatic dysfunction of lumbar region: Secondary | ICD-10-CM | POA: Diagnosis not present

## 2024-02-14 DIAGNOSIS — M5033 Other cervical disc degeneration, cervicothoracic region: Secondary | ICD-10-CM | POA: Diagnosis not present

## 2024-02-19 DIAGNOSIS — M9903 Segmental and somatic dysfunction of lumbar region: Secondary | ICD-10-CM | POA: Diagnosis not present

## 2024-02-19 DIAGNOSIS — M5033 Other cervical disc degeneration, cervicothoracic region: Secondary | ICD-10-CM | POA: Diagnosis not present

## 2024-02-19 DIAGNOSIS — M9901 Segmental and somatic dysfunction of cervical region: Secondary | ICD-10-CM | POA: Diagnosis not present

## 2024-02-19 DIAGNOSIS — M62830 Muscle spasm of back: Secondary | ICD-10-CM | POA: Diagnosis not present

## 2024-03-11 DIAGNOSIS — M5033 Other cervical disc degeneration, cervicothoracic region: Secondary | ICD-10-CM | POA: Diagnosis not present

## 2024-03-11 DIAGNOSIS — M9903 Segmental and somatic dysfunction of lumbar region: Secondary | ICD-10-CM | POA: Diagnosis not present

## 2024-03-11 DIAGNOSIS — M9901 Segmental and somatic dysfunction of cervical region: Secondary | ICD-10-CM | POA: Diagnosis not present

## 2024-03-11 DIAGNOSIS — M62830 Muscle spasm of back: Secondary | ICD-10-CM | POA: Diagnosis not present
